# Patient Record
Sex: Male | Born: 1945 | Race: White | Hispanic: No | Marital: Married | Smoking: Former smoker
Health system: Southern US, Community
[De-identification: ages and names within clinical notes are randomized; demographics above are authoritative.]

## PROBLEM LIST (undated history)

## (undated) DIAGNOSIS — T7840XA Allergy, unspecified, initial encounter: Secondary | ICD-10-CM

## (undated) DIAGNOSIS — K219 Gastro-esophageal reflux disease without esophagitis: Secondary | ICD-10-CM

## (undated) DIAGNOSIS — K649 Unspecified hemorrhoids: Secondary | ICD-10-CM

## (undated) DIAGNOSIS — E785 Hyperlipidemia, unspecified: Secondary | ICD-10-CM

## (undated) DIAGNOSIS — H269 Unspecified cataract: Secondary | ICD-10-CM

## (undated) DIAGNOSIS — N529 Male erectile dysfunction, unspecified: Secondary | ICD-10-CM

## (undated) DIAGNOSIS — E538 Deficiency of other specified B group vitamins: Secondary | ICD-10-CM

## (undated) DIAGNOSIS — Z860100 Personal history of colon polyps, unspecified: Secondary | ICD-10-CM

## (undated) DIAGNOSIS — F419 Anxiety disorder, unspecified: Secondary | ICD-10-CM

## (undated) DIAGNOSIS — J439 Emphysema, unspecified: Secondary | ICD-10-CM

## (undated) HISTORY — DX: Anxiety disorder, unspecified: F41.9

## (undated) HISTORY — PX: APPENDECTOMY: SHX54

## (undated) HISTORY — DX: Hyperlipidemia, unspecified: E78.5

## (undated) HISTORY — DX: Male erectile dysfunction, unspecified: N52.9

## (undated) HISTORY — PX: HERNIA REPAIR: SHX51

## (undated) HISTORY — DX: Personal history of colon polyps, unspecified: Z86.0100

## (undated) HISTORY — PX: FOOT SURGERY: SHX648

## (undated) HISTORY — DX: Gastro-esophageal reflux disease without esophagitis: K21.9

## (undated) HISTORY — PX: KNEE SURGERY: SHX244

## (undated) HISTORY — DX: Allergy, unspecified, initial encounter: T78.40XA

## (undated) HISTORY — DX: Deficiency of other specified B group vitamins: E53.8

## (undated) HISTORY — DX: Unspecified hemorrhoids: K64.9

## (undated) HISTORY — DX: Emphysema, unspecified: J43.9

## (undated) HISTORY — DX: Unspecified cataract: H26.9

## (undated) HISTORY — DX: Personal history of colonic polyps: Z86.010

## (undated) HISTORY — PX: NASAL SINUS SURGERY: SHX719

## (undated) HISTORY — PX: CATARACT EXTRACTION, BILATERAL: SHX1313

## (undated) HISTORY — PX: VASECTOMY: SHX75

---

## 2013-05-17 ENCOUNTER — Encounter: Payer: Self-pay | Admitting: Internal Medicine

## 2013-06-09 ENCOUNTER — Ambulatory Visit (INDEPENDENT_AMBULATORY_CARE_PROVIDER_SITE_OTHER): Payer: Managed Care, Other (non HMO) | Admitting: Internal Medicine

## 2013-06-09 ENCOUNTER — Encounter: Payer: Self-pay | Admitting: Internal Medicine

## 2013-06-09 ENCOUNTER — Ambulatory Visit: Payer: Self-pay | Admitting: Internal Medicine

## 2013-06-09 VITALS — BP 140/86 | HR 64 | Ht 70.0 in | Wt 184.0 lb

## 2013-06-09 DIAGNOSIS — K602 Anal fissure, unspecified: Secondary | ICD-10-CM

## 2013-06-09 DIAGNOSIS — Z86010 Personal history of colonic polyps: Secondary | ICD-10-CM

## 2013-06-09 DIAGNOSIS — K625 Hemorrhage of anus and rectum: Secondary | ICD-10-CM

## 2013-06-09 DIAGNOSIS — K219 Gastro-esophageal reflux disease without esophagitis: Secondary | ICD-10-CM

## 2013-06-09 MED ORDER — ESOMEPRAZOLE MAGNESIUM 20 MG PO CPDR: 20.0000 mg | DELAYED_RELEASE_CAPSULE | ORAL | Status: DC

## 2013-06-09 NOTE — Progress Notes (Signed)
HISTORY OF PRESENT ILLNESS:  Steven Mcknight is a 68 y.o. male presents to establish GI care. Patient has a history of chronic GERD as well as colon polyps for which she has been cared for in [place]. He presents today regarding these issues as well as recent problems with rectal bleeding. The patient reports long-standing problems with GERD. Currently takes Nexium 20 mg every other day. On this regimen some breakthrough reflux symptoms for which he takes antacids and H2 receptor antagonists on demand. Some mild chronic unchanged intermittent dysphagia. Reports unremarkable EGD about 10 years ago. Terms of rectal bleeding, few weeks ago passed a hard bowel movement with painful burning/tearing sensation. Thereafter, red blood on the tissue. Several times thereafter similar discomfort and minor bleeding. Self medicated with Analpram. The problem has resolved. His last colonoscopy was 06/10/2011. No polyps. Outside records requested. Has had multiple colonoscopies since age 32. About once every 5 years for history of polyps. GI review of systems is otherwise negative  REVIEW OF SYSTEMS:  All non-GI ROS negative upon comprehensive review  Past Medical History  Diagnosis Date  . Hx of colonic polyps     Colonoscopy's in [place]   . GERD (gastroesophageal reflux disease)   . Hyperlipemia     Past Surgical History  Procedure Laterality Date  . Appendectomy    . Hernia repair      Social History Steven Mcknight  reports that he has quit smoking. He has never used smokeless tobacco. He reports that he drinks alcohol. He reports that he does not use illicit drugs.  family history is negative for Colon cancer.  No Known Allergies     PHYSICAL EXAMINATION:  Vital signs: BP 140/86  Pulse 64  Ht 5\' 10"  (1.778 m)  Wt 184 lb (83.462 kg)  BMI 26.40 kg/m2 General: Well-developed, well-nourished, no acute distress HEENT: Sclerae are anicteric, conjunctiva pink. Oral mucosa intact Lungs: Clear Heart:  Regular Abdomen: soft, nontender, nondistended, no obvious ascites, no peritoneal signs, normal bowel sounds. No organomegaly. Extremities: No edema Psychiatric: alert and oriented x3. Cooperative    ASSESSMENT:  #1. GERD. Breakthrough symptoms on every other day low-dose PPI #2. History of colon polyps. Last colonoscopy February 2013 #3. Recent problems with rectal discomfort and bleeding almost certainly secondary to fissure. Resolved.   PLAN:  #1. Reflux precautions #2. Recommend taking Nexium 20 mg daily, as opposed every other day, given breakthrough #3. Daily fiber supplementation with Metamucil to avoid hard stool recurrent fissure #4. Surveillance colonoscopy around February 2018. Recall placed in computer. Interval followup as needed

## 2013-06-09 NOTE — Patient Instructions (Signed)
We have sent the following medications to your pharmacy for you to pick up at your convenience: Nexium 20 mg-Take 1 capsule daily  You will be due for a recall colonoscopy in 05/2016. We will send you a reminder in the mail when it gets closer to that time.

## 2014-05-02 DIAGNOSIS — R931 Abnormal findings on diagnostic imaging of heart and coronary circulation: Secondary | ICD-10-CM | POA: Insufficient documentation

## 2014-05-02 HISTORY — DX: Abnormal findings on diagnostic imaging of heart and coronary circulation: R93.1

## 2014-05-09 HISTORY — PX: NM MYOVIEW LTD: HXRAD82

## 2015-04-20 ENCOUNTER — Other Ambulatory Visit: Payer: Self-pay | Admitting: Acute Care

## 2015-04-20 DIAGNOSIS — Z87891 Personal history of nicotine dependence: Secondary | ICD-10-CM

## 2015-05-10 ENCOUNTER — Ambulatory Visit
Admission: RE | Admit: 2015-05-10 | Discharge: 2015-05-10 | Disposition: A | Discharge status: Home / Self Care | Payer: Managed Care, Other (non HMO) | Source: Ambulatory Visit | Attending: Acute Care | Admitting: Acute Care

## 2015-05-10 DIAGNOSIS — Z87891 Personal history of nicotine dependence: Secondary | ICD-10-CM

## 2015-06-12 ENCOUNTER — Ambulatory Visit (INDEPENDENT_AMBULATORY_CARE_PROVIDER_SITE_OTHER): Payer: Managed Care, Other (non HMO)

## 2015-06-12 ENCOUNTER — Ambulatory Visit: Payer: Self-pay

## 2015-06-12 ENCOUNTER — Ambulatory Visit (INDEPENDENT_AMBULATORY_CARE_PROVIDER_SITE_OTHER): Payer: Managed Care, Other (non HMO) | Admitting: Podiatry

## 2015-06-12 ENCOUNTER — Encounter: Payer: Self-pay | Admitting: Podiatry

## 2015-06-12 VITALS — BP 141/84 | HR 57 | Resp 16 | Ht 70.0 in | Wt 175.0 lb

## 2015-06-12 DIAGNOSIS — M2041 Other hammer toe(s) (acquired), right foot: Secondary | ICD-10-CM

## 2015-06-12 DIAGNOSIS — M2042 Other hammer toe(s) (acquired), left foot: Secondary | ICD-10-CM

## 2015-06-12 DIAGNOSIS — D3610 Benign neoplasm of peripheral nerves and autonomic nervous system, unspecified: Secondary | ICD-10-CM | POA: Diagnosis not present

## 2015-06-12 NOTE — Progress Notes (Signed)
   Subjective:    Patient ID: Steven Mcknight, male    DOB: July 29, 1945, 70 y.o.   MRN: [id number]  HPI Patient presents with bilateral toe pain; 4th toes going underneath 3rd toes. Pt stated, "Right foot is worse"; x1 yr.   Review of Systems  All other systems reviewed and are negative.      Objective:   Physical Exam        Assessment & Plan:

## 2015-06-13 NOTE — Progress Notes (Signed)
Subjective:     Patient ID: Steven Mcknight, male   DOB: Sep 06, 1945, 70 y.o.   MRN: [id number]  HPI patient states he has a lot of pain in his right fourth toe which especially bothers him with golf. He feels like the toes curled and it causes problems and states the left one also does this but not to the same degree   Review of Systems  All other systems reviewed and are negative.      Objective:   Physical Exam  Constitutional: He is oriented to person, place, and time.  Cardiovascular: Intact distal pulses.   Musculoskeletal: Normal range of motion.  Neurological: He is oriented to person, place, and time.  Skin: Skin is warm.  Nursing note and vitals reviewed.  neurovascular status intact muscle strength adequate range of motion within normal limits with patient found to have significant structural malalignment of the fourth digit right over left with an abutment of the fourth against the third toe but no keratotic lesion formation. 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 We are going to try different padding systems and I discussed at one point distal arthroplasty will probably be necessary but he still hold off until after golf season. He will try the pads wider shoes and be seen back as needed  X-ray report indicated that there is good alignment except for significant distal rotation fourth toe right over left

## 2015-07-17 DIAGNOSIS — M722 Plantar fascial fibromatosis: Secondary | ICD-10-CM

## 2015-11-23 ENCOUNTER — Emergency Department (HOSPITAL_COMMUNITY)
Admission: EM | Admit: 2015-11-23 | Discharge: 2015-11-23 | Disposition: A | Discharge status: Home / Self Care | Payer: Medicare Other | Attending: Emergency Medicine | Admitting: Emergency Medicine

## 2015-11-23 ENCOUNTER — Emergency Department (HOSPITAL_COMMUNITY): Payer: Medicare Other

## 2015-11-23 ENCOUNTER — Encounter (HOSPITAL_COMMUNITY): Payer: Self-pay | Admitting: Family Medicine

## 2015-11-23 DIAGNOSIS — J0100 Acute maxillary sinusitis, unspecified: Secondary | ICD-10-CM | POA: Diagnosis not present

## 2015-11-23 DIAGNOSIS — R42 Dizziness and giddiness: Secondary | ICD-10-CM | POA: Diagnosis present

## 2015-11-23 DIAGNOSIS — Z87891 Personal history of nicotine dependence: Secondary | ICD-10-CM | POA: Diagnosis not present

## 2015-11-23 DIAGNOSIS — Z7982 Long term (current) use of aspirin: Secondary | ICD-10-CM | POA: Insufficient documentation

## 2015-11-23 DIAGNOSIS — H8110 Benign paroxysmal vertigo, unspecified ear: Secondary | ICD-10-CM | POA: Diagnosis not present

## 2015-11-23 LAB — COMPREHENSIVE METABOLIC PANEL
ALT: 23 U/L (ref 17–63)
AST: 27 U/L (ref 15–41)
Albumin: 3.9 g/dL (ref 3.5–5.0)
Alkaline Phosphatase: 74 U/L (ref 38–126)
Anion gap: 6 (ref 5–15)
BUN: 13 mg/dL (ref 6–20)
CHLORIDE: 105 mmol/L (ref 101–111)
CO2: 25 mmol/L (ref 22–32)
CREATININE: 0.92 mg/dL (ref 0.61–1.24)
Calcium: 9.5 mg/dL (ref 8.9–10.3)
Glucose, Bld: 127 mg/dL — ABNORMAL HIGH (ref 65–99)
POTASSIUM: 4.9 mmol/L (ref 3.5–5.1)
SODIUM: 136 mmol/L (ref 135–145)
Total Bilirubin: 1.8 mg/dL — ABNORMAL HIGH (ref 0.3–1.2)
Total Protein: 6.5 g/dL (ref 6.5–8.1)

## 2015-11-23 LAB — I-STAT TROPONIN, ED: TROPONIN I, POC: 0 ng/mL (ref 0.00–0.08)

## 2015-11-23 LAB — CBC WITH DIFFERENTIAL/PLATELET
BASOS ABS: 0.1 10*3/uL (ref 0.0–0.1)
Basophils Relative: 1 %
EOS ABS: 0.4 10*3/uL (ref 0.0–0.7)
EOS PCT: 9 %
HCT: 46.3 % (ref 39.0–52.0)
Hemoglobin: 15.4 g/dL (ref 13.0–17.0)
Lymphocytes Relative: 26 %
Lymphs Abs: 1.1 10*3/uL (ref 0.7–4.0)
MCH: 31.4 pg (ref 26.0–34.0)
MCHC: 33.3 g/dL (ref 30.0–36.0)
MCV: 94.5 fL (ref 78.0–100.0)
MONO ABS: 0.4 10*3/uL (ref 0.1–1.0)
Monocytes Relative: 11 %
Neutro Abs: 2.1 10*3/uL (ref 1.7–7.7)
Neutrophils Relative %: 53 %
PLATELETS: 193 10*3/uL (ref 150–400)
RBC: 4.9 MIL/uL (ref 4.22–5.81)
RDW: 12.9 % (ref 11.5–15.5)
WBC: 4 10*3/uL (ref 4.0–10.5)

## 2015-11-23 LAB — MAGNESIUM: MAGNESIUM: 1.9 mg/dL (ref 1.7–2.4)

## 2015-11-23 MED ORDER — DIAZEPAM 5 MG PO TABS: 5.0000 mg | ORAL_TABLET | Freq: Once | ORAL | Status: DC

## 2015-11-23 MED ORDER — SODIUM CHLORIDE 0.9 % IV BOLUS (SEPSIS)
1000.0000 mL | Freq: Once | INTRAVENOUS | Status: AC
  Administered 2015-11-23: 1000 mL via INTRAVENOUS

## 2015-11-23 MED ORDER — AMOXICILLIN-POT CLAVULANATE 875-125 MG PO TABS: 1.0000 | ORAL_TABLET | Freq: Two times a day (BID) | ORAL | 0 refills | Status: AC

## 2015-11-23 MED ORDER — AMOXICILLIN-POT CLAVULANATE 875-125 MG PO TABS: 1.0000 | ORAL_TABLET | Freq: Two times a day (BID) | ORAL | 0 refills | Status: DC

## 2015-11-23 MED ORDER — MECLIZINE HCL 25 MG PO TABS: 25.0000 mg | ORAL_TABLET | Freq: Three times a day (TID) | ORAL | 0 refills | Status: DC | PRN

## 2015-11-23 MED ORDER — MECLIZINE HCL 12.5 MG PO TABS: 12.5000 mg | ORAL_TABLET | Freq: Three times a day (TID) | ORAL | 0 refills | Status: DC | PRN

## 2015-11-23 MED ORDER — MECLIZINE HCL 25 MG PO TABS
25.0000 mg | ORAL_TABLET | Freq: Once | ORAL | Status: AC
  Administered 2015-11-23: 25 mg via ORAL
  Filled 2015-11-23: qty 1

## 2015-11-23 NOTE — ED Notes (Signed)
Transported to MRI

## 2015-11-23 NOTE — ED Notes (Signed)
Patient c/o dizziness onset yest am. States he isn't dizzy as long as he is sitting down.

## 2015-11-23 NOTE — ED Notes (Signed)
Patient remains in MRI 

## 2015-11-23 NOTE — ED Triage Notes (Signed)
Pt here for dizziness since yesterday morning. sts that he woke up this way. sts slight HA. Denies any numbness, weakness. No slurred speech. No facial droop. sts when he stands up the room spins. sts whe he lays still he isnt dizzy.

## 2015-11-23 NOTE — ED Provider Notes (Signed)
[facility] DEPT Provider Note   CSN: [id number] Arrival date & time: 11/23/15  [id number]  First Provider Contact:  First MD Initiated Contact with Patient 11/23/15 [phone]     History   Chief Complaint Chief Complaint  Patient presents with  . Dizziness    HPI Steven Mcknight is a 70 y.o. male.  HPI 70 year old male with past medical history of hypertension who presents with a 2 day history of intermittent dizziness. The patient states he was in his usual state of health 2 days ago. He golfed18 holes and had no difficulty. However, upon awakening in the morning yesterday he noticed that he had a sensation of dizziness. He describes the sensation as feeling as though the room was spinning with inability to walk due to feeling "drunk." Denies any alcohol use. His symptoms have persisted intermittently throughout the last 2 days. However, he has been able to drive and otherwise walk without difficulty. Denies any associated dysarthria, numbness, weakness, vision changes, or other neurological deficits. He states that his symptoms do worsen with position changes, as well as movement of his head. Denies any fevers or chills. Denies any recent head trauma.  Past Medical History:  Diagnosis Date  . GERD (gastroesophageal reflux disease)   . Hx of colonic polyps    Colonoscopy's in [place]   . Hyperlipemia     There are no active problems to display for this patient.   Past Surgical History:  Procedure Laterality Date  . APPENDECTOMY    . HERNIA REPAIR         Home Medications    Prior to Admission medications   Medication Sig Start Date End Date Taking? Authorizing Provider  aspirin 81 MG tablet Take 81 mg by mouth daily.   Yes Historical Provider, MD  atorvastatin (LIPITOR) 40 MG tablet Take 40 mg by mouth daily.   Yes Historical Provider, MD  esomeprazole (NEXIUM) 20 MG capsule Take 1 capsule (20 mg total) by mouth every other day. Patient taking differently: Take 20 mg by mouth daily.   06/09/13  Yes Irene Shipper, MD  OVER THE COUNTER MEDICATION Place 2 sprays into both nostrils as needed (for congestion). OTC [employer] Nasal Spray   Yes Historical Provider, MD  amoxicillin-clavulanate (AUGMENTIN) 875-125 MG tablet Take 1 tablet by mouth every 12 (twelve) hours. 11/23/15 12/03/15  Duffy Bruce, MD  meclizine (ANTIVERT) 12.5 MG tablet Take 1-2 tablets (12.5-25 mg total) by mouth 3 (three) times daily as needed for dizziness (Take 1 tablet for mild dizziness, take 2 tablets for severe dizziness or nausea). 11/23/15   Duffy Bruce, MD    Family History Family History  Problem Relation Age of Onset  . Colon cancer Neg Hx     Social History Social History  Substance Use Topics  . Smoking status: Former Research scientist (life sciences)  . Smokeless tobacco: Never Used  . Alcohol use Yes     Comment: one daily      Allergies   Review of patient's allergies indicates no known allergies.   Review of Systems Review of Systems  Constitutional: Positive for fatigue. Negative for activity change and fever.  HENT: Negative for congestion and rhinorrhea.   Eyes: Negative for visual disturbance.  Respiratory: Negative for cough and shortness of breath.   Cardiovascular: Negative for chest pain and leg swelling.  Gastrointestinal: Negative for abdominal pain, diarrhea and nausea.  Genitourinary: Negative for dysuria and flank pain.  Musculoskeletal: Negative for neck pain and neck stiffness.  Neurological: Positive for  dizziness, light-headedness and headaches. Negative for speech difficulty.     Physical Exam Updated Vital Signs BP 153/89 (BP Location: Right Arm)   Pulse (!) 51   Temp 98.3 F (36.8 C) (Oral)   Resp 18   SpO2 99%   Physical Exam  Constitutional: He appears well-developed and well-nourished. No distress.  HENT:  Head: Normocephalic.  Mouth/Throat: Oropharynx is clear and moist. No oropharyngeal exudate.  Eyes: Conjunctivae are normal. Pupils are equal, round, and reactive to  light.  Neck: Normal range of motion. Neck supple.  Cardiovascular: Normal rate, regular rhythm and normal heart sounds.  Exam reveals no friction rub.   No murmur heard. Pulmonary/Chest: Effort normal and breath sounds normal. No respiratory distress. He has no wheezes. He has no rales.  Abdominal: Soft. He exhibits no distension. There is no tenderness. There is no guarding.  Musculoskeletal: He exhibits no edema.  Neurological: He is alert. He exhibits normal muscle tone.  Skin: Skin is warm.  Nursing note and vitals reviewed.   Neurological Exam:  Mental Status: Alert and oriented to person, place, and time. Attention and concentration normal. Speech clear. Recent memory is intact. Cranial Nerves: Visual fields intact to confrontation in all quadrants bilaterally. EOMI and PERRLA. No nystagmus noted. Facial sensation intact at forehead, maxillary cheek, and chin/mandible bilaterally. No weakness of masticatory muscles. No facial asymmetry or weakness. Hearing grossly normal to finer rub. Uvula is midline, and palate elevates symmetrically. Normal SCM and trapezius strength. Tongue midline without fasciculations. No nystagmus. Motor: Muscle strength 5/5 in proximal and distal UE and LE bilaterally. No pronator drift. Muscle tone normal. Reflexes: 2+ and symmetrical in all four extremities.   Sensation: Intact to light touch in upper and lower extremities distally bilaterally.  Gait: Normal without ataxia.  Coordination: Normal FTN bilaterally. Mild, low frequency central tremor.    ED Treatments / Results  Labs (all labs ordered are listed, but only abnormal results are displayed) Labs Reviewed  COMPREHENSIVE METABOLIC PANEL - Abnormal; Notable for the following:       Result Value   Glucose, Bld 127 (*)    Total Bilirubin 1.8 (*)    All other components within normal limits  CBC WITH DIFFERENTIAL/PLATELET  MAGNESIUM  I-STAT TROPOININ, ED    EKG  EKG  Interpretation  Date/Time:  Thursday November 23 2015 07:18:36 EDT Ventricular Rate:  84 PR Interval:    QRS Duration: 97 QT Interval:  386 QTC Calculation: 457 R Axis:   -71 Text Interpretation:  Sinus rhythm Multiple ventricular premature complexes Probable left atrial enlargement Left anterior fascicular block Consider anterior infarct Baseline wander in lead(s) V3 No old tracing to compare Non-specific ST-t changes Confirmed by Ellender Hose MD, Lysbeth Galas [phone]) on 11/23/2015 7:51:06 AM       Radiology Mr Brain Wo Contrast  Result Date: 11/23/2015 CLINICAL DATA:  Dizziness beginning yesterday. EXAM: MRI HEAD WITHOUT CONTRAST TECHNIQUE: Multiplanar, multiecho pulse sequences of the brain and surrounding structures were obtained without intravenous contrast. COMPARISON:  None. FINDINGS: Diffusion imaging does not show any acute or subacute infarction. The brain appears normal without evidence of any old small or large vessel insult. No mass lesion, hemorrhage, hydrocephalus or extra-axial collection. Major vessels at the base of the brain show flow. No fluid in the middle ears or mastoids. There is mucosal inflammatory disease of the paranasal sinuses with mucosal thickening and retention cysts. IMPRESSION: Normal appearance of the brain itself. Mucosal inflammatory disease of the paranasal sinuses. Electronically Signed  By: Nelson Chimes M.D.   On: 11/23/2015 09:59    Procedures Procedures (including critical care time)  Medications Ordered in ED Medications  sodium chloride 0.9 % bolus 1,000 mL (0 mLs Intravenous Stopped 11/23/15 1050)  meclizine (ANTIVERT) tablet 25 mg (25 mg Oral Given 11/23/15 0800)     Initial Impression / Assessment and Plan / ED Course  I have reviewed the triage vital signs and the nursing notes.  Pertinent labs & imaging results that were available during my care of the patient were reviewed by me and considered in my medical decision making (see chart for  details).  Clinical Course   70 yo M with PMHx as above who p/w mild headache and new onset, positional vertigo. VSS and WNL. Exam is as above. Findings are most c/w BPPV or other peripheral vertigo, though nystagmus not impressive on exam. Moreover, pt noted to have mild, central tremor and he is unsure if this is new. While it is possible this is a chronic issue, given his c/o vertigo with ? New tremor will obtain MR Brain to eval for posterior CVA. Otherwise, screening lab work is unremarkable. EKG is non-ischemic and trop negative despite sx x 2 days - doubt ACS or arrhythmia. Pt in agreement. No improvement with attempted Dix-Hallpike.  MRI negative. Symptoms improved with meclizine. Will d/c with meclizine, outpatient ENT follow-up, and return precautions.  Final Clinical Impressions(s) / ED Diagnoses   Final diagnoses:  Acute maxillary sinusitis, recurrence not specified  BPPV (benign paroxysmal positional vertigo), unspecified laterality    New Prescriptions Discharge Medication List as of 11/23/2015 10:17 AM       Duffy Bruce, MD 11/23/15 2020

## 2015-12-14 ENCOUNTER — Other Ambulatory Visit: Payer: Self-pay | Admitting: Acute Care

## 2015-12-14 DIAGNOSIS — Z87891 Personal history of nicotine dependence: Secondary | ICD-10-CM

## 2015-12-18 DIAGNOSIS — H8120 Vestibular neuronitis, unspecified ear: Secondary | ICD-10-CM | POA: Insufficient documentation

## 2015-12-18 DIAGNOSIS — J342 Deviated nasal septum: Secondary | ICD-10-CM | POA: Insufficient documentation

## 2015-12-18 DIAGNOSIS — R0683 Snoring: Secondary | ICD-10-CM | POA: Insufficient documentation

## 2015-12-18 DIAGNOSIS — R51 Headache: Secondary | ICD-10-CM

## 2015-12-18 DIAGNOSIS — R519 Headache, unspecified: Secondary | ICD-10-CM | POA: Insufficient documentation

## 2015-12-18 DIAGNOSIS — K219 Gastro-esophageal reflux disease without esophagitis: Secondary | ICD-10-CM | POA: Insufficient documentation

## 2016-02-27 ENCOUNTER — Telehealth: Payer: Self-pay | Admitting: *Deleted

## 2016-02-27 NOTE — Telephone Encounter (Signed)
"  I am calling to schedule my surgery with Dr. Paulla Dolly.  He told me to call when I was ready to have it done."  Have you signed consent forms?  "I Buster't think I have."  You are going to need to see Dr. Paulla Dolly for a consultation.  "I already saw him."  You saw him for a regular visit.  You will need to see him for a consultation appointment for surgery.  You will receive a 3 page consent form to sign and a surgical kit.  I can put you down tentatively for surgery.  When would you like to schedule?  "I would like to do it January 16."  I will put you down tentatively for that date.  I will transfer you to a scheduler so you can make a consult appointment with Dr. Paulla Dolly.

## 2016-03-25 ENCOUNTER — Ambulatory Visit (INDEPENDENT_AMBULATORY_CARE_PROVIDER_SITE_OTHER): Payer: Medicare Other | Admitting: Podiatry

## 2016-03-25 ENCOUNTER — Encounter: Payer: Self-pay | Admitting: Podiatry

## 2016-03-25 VITALS — BP 153/95 | HR 75

## 2016-03-25 DIAGNOSIS — D3610 Benign neoplasm of peripheral nerves and autonomic nervous system, unspecified: Secondary | ICD-10-CM

## 2016-03-25 DIAGNOSIS — M2041 Other hammer toe(s) (acquired), right foot: Secondary | ICD-10-CM

## 2016-03-25 NOTE — Patient Instructions (Signed)

## 2016-03-27 ENCOUNTER — Telehealth: Payer: Self-pay | Admitting: *Deleted

## 2016-03-27 NOTE — Progress Notes (Signed)
Subjective:     Patient ID: Steven Mcknight, male   DOB: [birth year], 70 y.o.   MRN: [id number]  HPI patient states that the fourth toe on the right foot has been very sore at the distal portion and making it hard for the patient to wear shoe gear comfortably. States that he's tried modalities and wants to have the toe straightened   Review of Systems     Objective:   Physical Exam Neurovascular status intact muscle strength adequate with patient found have distal rotation fourth digit right with keratotic lesion formation on the lateral portion that is sore when palpated with no current neuroma symptomatology    Assessment:     What appears to be distal rotation fourth digit right that's causing symptoms    Plan:     Spent a great of time going over with this patient the etiology of this and discussing the causes of a rotated toe. Patient wants to have surgery and at this point I did allow patient to read consent form going over alternative treatments complications for derotational arthroplasty. There is no guarantees this will solve all symptoms and patient understands this and signs consent form and is scheduled for outpatient surgery [facility]

## 2016-03-27 NOTE — Telephone Encounter (Signed)
"  I'm scheduled for surgery on January 16.  I want to move it to January 9."  Dr. Paulla Dolly does not have anything available on January 9.  "Okay, leave it where it is."

## 2016-05-07 ENCOUNTER — Encounter: Payer: Self-pay | Admitting: Podiatry

## 2016-05-07 DIAGNOSIS — M2041 Other hammer toe(s) (acquired), right foot: Secondary | ICD-10-CM | POA: Diagnosis not present

## 2016-05-10 ENCOUNTER — Telehealth: Payer: Self-pay

## 2016-05-10 NOTE — Telephone Encounter (Signed)
Spoke with pt regarding post operative status. He asked abouit shoe usage. Advised him to wear surgical shoe or boot at all times. He states he is managing pain effectively. Denied s/s of infection. He is to call with any acute symptom changes

## 2016-05-15 ENCOUNTER — Ambulatory Visit (INDEPENDENT_AMBULATORY_CARE_PROVIDER_SITE_OTHER): Payer: Self-pay | Admitting: Podiatry

## 2016-05-15 ENCOUNTER — Encounter: Payer: Self-pay | Admitting: Podiatry

## 2016-05-15 ENCOUNTER — Ambulatory Visit (INDEPENDENT_AMBULATORY_CARE_PROVIDER_SITE_OTHER): Payer: Medicare Other

## 2016-05-15 VITALS — Temp 98.7°F

## 2016-05-15 DIAGNOSIS — M2041 Other hammer toe(s) (acquired), right foot: Secondary | ICD-10-CM

## 2016-05-15 NOTE — Progress Notes (Signed)
Subjective:     Patient ID: Steven Mcknight, male   DOB: [birth year], 71 y.o.   MRN: [id number]  HPI patient states she's doing fine with no pain   Review of Systems     Objective:   Physical Exam Neurovascular status intact with patient's fourth digit right doing well wound edges well coapted with good alignment of the toe    Assessment:     Doing well post arthroplasty fourth digit right    Plan:     X-ray reviewed sterile dressing applied and reappoint 2 weeks for stitch removal or earlier if needed  X-ray report indicated digits and good alignment with no indications of pathology

## 2016-05-20 NOTE — Progress Notes (Signed)
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

## 2016-05-22 ENCOUNTER — Ambulatory Visit (INDEPENDENT_AMBULATORY_CARE_PROVIDER_SITE_OTHER): Payer: Medicare Other | Admitting: Neurology

## 2016-05-22 ENCOUNTER — Encounter: Payer: Self-pay | Admitting: Neurology

## 2016-05-22 VITALS — BP 136/70 | HR 78 | Resp 16 | Ht 70.0 in | Wt 177.0 lb

## 2016-05-22 DIAGNOSIS — K219 Gastro-esophageal reflux disease without esophagitis: Secondary | ICD-10-CM

## 2016-05-22 DIAGNOSIS — R519 Headache, unspecified: Secondary | ICD-10-CM

## 2016-05-22 DIAGNOSIS — R51 Headache: Secondary | ICD-10-CM

## 2016-05-22 DIAGNOSIS — G2581 Restless legs syndrome: Secondary | ICD-10-CM | POA: Diagnosis not present

## 2016-05-22 DIAGNOSIS — R400 Somnolence: Secondary | ICD-10-CM

## 2016-05-22 DIAGNOSIS — G253 Myoclonus: Secondary | ICD-10-CM

## 2016-05-22 DIAGNOSIS — R0683 Snoring: Secondary | ICD-10-CM

## 2016-05-22 DIAGNOSIS — G478 Other sleep disorders: Secondary | ICD-10-CM

## 2016-05-22 NOTE — Patient Instructions (Addendum)
Based on your symptoms and your exam I believe you are at risk for obstructive sleep apnea or OSA, and I think we should proceed with a sleep study to determine whether you do or do not have OSA and how severe it is. If you have more than mild OSA, I want you to consider treatment with CPAP. Please remember, the risks and ramifications of moderate to severe obstructive sleep apnea or OSA are: Cardiovascular disease, including congestive heart failure, stroke, difficult to control hypertension, arrhythmias, and even type 2 diabetes has been linked to untreated OSA. Sleep apnea causes disruption of sleep and sleep deprivation in most cases, which, in turn, can cause recurrent headaches, problems with memory, mood, concentration, focus, and vigilance. Most people with untreated sleep apnea report excessive daytime sleepiness, which can affect their ability to drive. Please do not drive if you feel sleepy.   You may have mild restless legs symptoms, we will look I will likely see you back after your sleep study to go over the test results and where to go from there. We will call you after your sleep study to advise about the results (most likely, you will hear from Beverlee Nims, my nurse) and to set up an appointment at the time, as necessary.    Our sleep lab administrative assistant, Arrie Aran will meet with you or call you to schedule your sleep study. If you Amedee't hear back from her by next week please feel free to call her at [phone]. This is her direct line and please leave a message with your phone number to call back if you get the voicemail box. She will call back as soon as possible.

## 2016-05-22 NOTE — Progress Notes (Addendum)
Subjective:    Patient ID: Steven Mcknight is a 71 y.o. male.  HPI     Star Age, MD, PhD [facility] [address] [place], [place] [address]  Dear Dr. Ardeth Perfect,   I saw your patient, Steven Mcknight, upon your kind request in my neurologic clinic today for initial consultation of his sleep disorder, in particular, concern for underlying obstructive sleep apnea. The patient is unaccompanied today. As you know, [name] is a 71 year old right-handed gentleman with an underlying medical history of hyperlipidemia, hyperbilirubinemia, anxiety, overweight state, reflux disease, and recent right fourth hammertoe repair on 05/07/2016, who reports snoring and excessive daytime somnolence, occasional morning headaches. I reviewed your office note from 04/29/2016. Of note, his Epworth sleepiness score is 11 out of 24 today, his fatigue score is 36 out of 63. He is retired, he lives with his wife, has 2 grown children in [place], stepchild in [place] as well. He quit smoking 10 years ago and drinks alcohol one drink/day, occasional cigar, usually no day-to-day caffeine, typically decaf coffee or half-and-half in the morning. He tries to get enough sleep, goes to bed between 9 PM and 11 PM typically, wakeup time between 7 and 7:30 AM but does not typically wake up rested, does not have night to night nocturia, has occasional jerking movements in his sleep, particularly with leg jerking, endorses occasional restless leg symptoms. He has occasional morning headaches, no personal history of migraine headaches. He does have nocturnal reflux symptoms and postnasal drip. He uses Xanax as needed, not daily, he is on a daily low-dose aspirin, atorvastatin and Nexium for his reflux.   He has a Hx of RLL lung nodules. Being monitored for this. I reviewed his CT chest report from January 2017, I understand he has to be scheduled for a repeat scan to monitor. His weight has remained fairly  stable. He exercises regularly. He is retired from [employer].   His Past Medical History Is Significant For: Past Medical History:  Diagnosis Date  . Anxiety   . GERD (gastroesophageal reflux disease)   . Hx of colonic polyps    Colonoscopy's in [place]   . Hyperlipemia     His Past Surgical History Is Significant For: Past Surgical History:  Procedure Laterality Date  . APPENDECTOMY    . HERNIA REPAIR    . KNEE SURGERY    . NASAL SINUS SURGERY      His Family History Is Significant For: Family History  Problem Relation Age of Onset  . Melanoma Father   . Colon cancer Neg Hx     His Social History Is Significant For: Social History   Social History  . Marital status: Married    Spouse name: N/A  . Number of children: 2  . Years of education: BS   Occupational History  . Retired    Social History Main Topics  . Smoking status: Former Research scientist (life sciences)  . Smokeless tobacco: Never Used  . Alcohol use Yes     Comment: one daily   . Drug use: No  . Sexual activity: Not Asked   Other Topics Concern  . None   Social History Narrative   Daily caffeine           His Allergies Are:  No Known Allergies:   His Current Medications Are:  Outpatient Encounter Prescriptions as of 05/22/2016  Medication Sig  . ALPRAZolam (XANAX) 0.25 MG tablet Take 0.25 mg by mouth as  needed for anxiety.  Marland Kitchen aspirin 81 MG tablet Take 81 mg by mouth daily.  Marland Kitchen atorvastatin (LIPITOR) 40 MG tablet Take 40 mg by mouth daily.  Marland Kitchen esomeprazole (NEXIUM) 20 MG capsule Take 1 capsule (20 mg total) by mouth every other day. (Patient taking differently: Take 20 mg by mouth daily. )  . esomeprazole (NEXIUM) 20 MG capsule Take by mouth.  Marland Kitchen OVER THE COUNTER MEDICATION Place 2 sprays into both nostrils as needed (for congestion). OTC [employer] Nasal Spray  . [DISCONTINUED] HYDROcodone-acetaminophen (NORCO/VICODIN) 5-325 MG tablet Take 1 tablet by mouth every 4 (four) hours as needed for moderate pain.  .  [DISCONTINUED] meclizine (ANTIVERT) 12.5 MG tablet Take 1-2 tablets (12.5-25 mg total) by mouth 3 (three) times daily as needed for dizziness (Take 1 tablet for mild dizziness, take 2 tablets for severe dizziness or nausea).   No facility-administered encounter medications on file as of 05/22/2016.   : Review of Systems:  Out of a complete 14 point review of systems, all are reviewed and negative with the exception of these symptoms as listed below: Review of Systems  Neurological:       No trouble falling and staying asleep, snores, wakes up feeling tired, daytime fatigue, occasional morning headache.   States that he has never had a sleep study.   Epworth Sleepiness Scale 0= would never doze 1= slight chance of dozing 2= moderate chance of dozing 3= high chance of dozing  Sitting and reading:1 Watching TV: 3 Sitting inactive in a public place (ex. Theater or meeting):1 As a passenger in a car for an hour without a break:1 Lying down to rest in the afternoon:3 Sitting and talking to someone:0 Sitting quietly after lunch (no alcohol):2 In a car, while stopped in traffic:0 Total:11   Objective:  Neurologic Exam  Physical Exam Physical Examination:   Vitals:   05/22/16 1059  BP: 136/70  Pulse: 78  Resp: 16    General Examination: The patient is a very pleasant 71 y.o. male in no acute distress. He appears well-developed and well-nourished and well groomed.   HEENT: Normocephalic, atraumatic, pupils are equal, round and reactive to light and accommodation. Funduscopic exam is normal with sharp disc margins noted. Status post bilateral cataract repairs. Extraocular tracking is good without limitation to gaze excursion or nystagmus noted. Normal smooth pursuit is noted. Hearing is grossly intact. Face is symmetric with normal facial animation and normal facial sensation. Speech is clear with no dysarthria noted. There is no hypophonia. There is no lip, neck/head, jaw or voice  tremor. Neck is supple with full range of passive and active motion. There are no carotid bruits on auscultation. Oropharynx exam reveals: mild mouth dryness, adequate dental hygiene and mild airway crowding, due to longer uvula, thicker tongue, tonsils in place, 1+. Mallampati is class II. Tongue protrudes centrally and palate elevates symmetrically. Neck size is 16.25 inches. He has no significant overbite, has a slightly shifted jaw alignment, does report TMJ issues on the left, nasal inspection shows mild deviated septum to the left, otherwise benign findings.   Chest: Clear to auscultation without wheezing, rhonchi or crackles noted with the exception of mild crackles noted at the right lung base.  Heart: S1+S2+0, regular and normal without murmurs, rubs or gallops noted.   Abdomen: Soft, non-tender and non-distended with normal bowel sounds appreciated on auscultation.  Extremities: There is no pitting edema in the distal lower extremities bilaterally. Pedal pulses are intact.  Skin: Warm and dry without  trophic changes noted.  Musculoskeletal: exam reveals no obvious joint deformities, tenderness or joint swelling or erythema.   Neurologically:  Mental status: The patient is awake, alert and oriented in all 4 spheres. His immediate and remote memory, attention, language skills and fund of knowledge are appropriate. There is no evidence of aphasia, agnosia, apraxia or anomia. Speech is clear with normal prosody and enunciation. Thought process is linear. Mood is normal and affect is normal.  Cranial nerves II - XII are as described above under HEENT exam. In addition: shoulder shrug is normal with equal shoulder height noted. Motor exam: Normal bulk, strength and tone is noted. There is no drift, tremor or rebound. Romberg is negative. Reflexes are 2+ throughout. Fine motor skills and coordination: intact with normal finger taps, normal hand movements, normal rapid alternating patting, normal  foot taps and normal foot agility.  Cerebellar testing: No dysmetria or intention tremor on finger to nose testing. Heel to shin is unremarkable bilaterally. There is no truncal or gait ataxia.  Sensory exam: intact to light touch, pinprick, vibration, temperature sense in the upper and lower extremities.  Gait, station and balance: He stands easily. No veering to one side is noted. No leaning to one side is noted. Posture is age-appropriate and stance is narrow based. Gait shows normal stride length and normal pace. No problems turning are noted. Tandem walk is unremarkable.  Assessment and plan:  In summary, Fionn Willitts is a very pleasant 71 y.o.-year old male with an underlying medical history of hyperlipidemia, hyperbilirubinemia, anxiety, overweight state, reflux disease, and recent right fourth hammertoe repair on 05/07/2016, who reports snoring and excessive daytime somnolence, occasional morning headaches, his history and physical exam are concerning for obstructive sleep apnea (OSA). In addition, he endorses RLS symptoms and leg jerking occasionally at night, also night time reflux Sx.  I had a long chat with the patient about my findings and the diagnosis of OSA, its prognosis and treatment options. We talked about medical treatments, surgical interventions and non-pharmacological approaches. I explained in particular the risks and ramifications of untreated moderate to severe OSA, especially with respect to developing cardiovascular disease down the Road, including congestive heart failure, difficult to treat hypertension, cardiac arrhythmias, or stroke. Even type 2 diabetes has, in part, been linked to untreated OSA. Symptoms of untreated OSA include daytime sleepiness, memory problems, mood irritability and mood disorder such as depression and anxiety, lack of energy, as well as recurrent headaches, especially morning headaches. We talked about trying to maintain a healthy lifestyle in general, as  well as the importance of weight control. I encouraged the patient to eat healthy, exercise daily and keep well hydrated, to keep a scheduled bedtime and wake time routine, to not skip any meals and eat healthy snacks in between meals. I advised the patient not to drive when feeling sleepy. I recommended the following at this time: sleep study with potential positive airway pressure titration. (We will score hypopneas at 4% and split the sleep study into diagnostic and treatment portion, if has has evidence of severe OSA).   I explained the sleep test procedure to the patient and also outlined possible surgical and non-surgical treatment options of OSA, including the use of a custom-made dental device (which would require a referral to a specialist dentist or oral surgeon), upper airway surgical options, such as pillar implants, radiofrequency surgery, tongue base surgery, and UPPP (which would involve a referral to an ENT surgeon). Rarely, jaw surgery such as mandibular  advancement may be considered.  I also explained the CPAP treatment option to the patient, who indicated that he would be willing to try CPAP if the need arises. I answered all his questions today and the patient was in agreement. I would like to see him back after the sleep study is completed and encouraged him to call with any interim questions, concerns, problems or updates.   Thank you very much for allowing me to participate in the care of this nice patient. If I can be of any further assistance to you please do not hesitate to call me at [phone].  Sincerely,   Star Age, MD, PhD

## 2016-05-28 ENCOUNTER — Other Ambulatory Visit: Payer: Self-pay | Admitting: Internal Medicine

## 2016-05-28 DIAGNOSIS — R911 Solitary pulmonary nodule: Secondary | ICD-10-CM

## 2016-05-29 ENCOUNTER — Inpatient Hospital Stay
Admission: RE | Admit: 2016-05-29 | Discharge: 2016-05-29 | Disposition: A | Discharge status: Home / Self Care | Payer: Medicare Other | Source: Ambulatory Visit | Attending: Internal Medicine | Admitting: Internal Medicine

## 2016-05-29 ENCOUNTER — Ambulatory Visit (INDEPENDENT_AMBULATORY_CARE_PROVIDER_SITE_OTHER): Payer: Medicare Other | Admitting: Podiatry

## 2016-05-29 DIAGNOSIS — D3610 Benign neoplasm of peripheral nerves and autonomic nervous system, unspecified: Secondary | ICD-10-CM

## 2016-05-29 DIAGNOSIS — M2041 Other hammer toe(s) (acquired), right foot: Secondary | ICD-10-CM

## 2016-05-31 ENCOUNTER — Ambulatory Visit
Admission: RE | Admit: 2016-05-31 | Discharge: 2016-05-31 | Disposition: A | Discharge status: Home / Self Care | Payer: Medicare Other | Source: Ambulatory Visit | Attending: Internal Medicine | Admitting: Internal Medicine

## 2016-05-31 DIAGNOSIS — R911 Solitary pulmonary nodule: Secondary | ICD-10-CM

## 2016-06-04 ENCOUNTER — Ambulatory Visit (INDEPENDENT_AMBULATORY_CARE_PROVIDER_SITE_OTHER): Payer: Medicare Other | Admitting: Neurology

## 2016-06-04 DIAGNOSIS — G4720 Circadian rhythm sleep disorder, unspecified type: Secondary | ICD-10-CM

## 2016-06-04 DIAGNOSIS — G4733 Obstructive sleep apnea (adult) (pediatric): Secondary | ICD-10-CM | POA: Diagnosis not present

## 2016-06-04 DIAGNOSIS — G4761 Periodic limb movement disorder: Secondary | ICD-10-CM

## 2016-06-04 DIAGNOSIS — R9431 Abnormal electrocardiogram [ECG] [EKG]: Secondary | ICD-10-CM

## 2016-06-07 NOTE — Progress Notes (Signed)
Patient referred by Dr. Ardeth Perfect, seen by me on 05/22/16, diagnostic PSG on 06/04/16.   Please call and notify the patient that the recent sleep study did not show any significant obstructive sleep apnea: an overall high normal AHI of 4.2/hour, mild obstructive sleep apnea during REM sleep with a  REM AHI of 9.4/hour, and moderate supine sleep apnea with a supine AHI of 24.4/hour, and O2 nadir of 90%. CPAP therapy is not warranted, but it may help to sleep off the back and lose some  weight. This may also help the mild to moderate snoring. He had moderate PLMs (periodic limb movements of sleep). Patient does endorse RLS symptoms. Please inform patient that I would like to go over the details of the study during a follow up appointment. Arrange a followup appointment. Also, route or fax report to PCP and referring MD, if other than PCP.  Once you have spoken to patient, you can close this encounter.   Thanks,  Star Age, MD, PhD [facility])

## 2016-06-07 NOTE — Procedures (Signed)
PATIENT'S NAME:  Steven Mcknight, Steven Mcknight DOB:      03-Nov-1945      MR#:    [id number]     DATE OF RECORDING: 06/04/2016 REFERRING M.D.:  Velna Hatchet, MD Study Performed:   Baseline Polysomnogram HISTORY: Mr. Dambra is a 71 year old man with a history of hyperlipidemia, hyperbilirubinemia, anxiety, overweight state, reflux disease, and recent right fourth hammertoe repair on 05/07/2016, who reports snoring and excessive daytime somnolence, occasional morning headaches.  The patient endorsed the Epworth Sleepiness Scale at 11/24 points. The patient's weight 177 pounds with a height of 70 (inches), resulting in a BMI of 25.2 kg/m2. The patient's neck circumference measured 16.2 inches.  CURRENT MEDICATIONS: Alprazolam, Aspirin, Atorvastatin, Esomeprazole   PROCEDURE:  This is a multichannel digital polysomnogram utilizing the Somnostar 11.2 system.  Electrodes and sensors were applied and monitored per AASM Specifications.   EEG, EOG, Chin and Limb EMG, were sampled at 200 Hz.  ECG, Snore and Nasal Pressure, Thermal Airflow, Respiratory Effort, CPAP Flow and Pressure, Oximetry was sampled at 50 Hz. Digital video and audio were recorded.      BASELINE STUDY  Lights Out was at 21:21 and Lights On at 05:01.  Total recording time (TRT) was 461 minutes, with a total sleep time (TST) of  286.5 minutes.   The patient's sleep latency was 78.5 minutes, which is prolonged.  REM latency was 91 minutes, which is normal.  The sleep efficiency was 62.1 %, which is reduced.     SLEEP ARCHITECTURE: WASO (Wake after sleep onset) was 113 minutes with several longer periods of wakefulness.  There were 17.5 minutes in Stage N1, 224.5 minutes Stage N2, 0 minutes Stage N3 and 44.5 minutes in Stage REM.  The percentage of Stage N1 was 6.1%, Stage N2 was 78.4%, which is increased, Stage N3 was absent, and Stage R (REM sleep) was 15.5%, which is mildly reduced.   The arousals were noted as: 24 were spontaneous, 19 were associated with  PLMs, 20 were associated with respiratory events.   Audio and video analysis did not show any abnormal or unusual movements, behaviors, phonations or vocalizations.   The patient took no bathroom breaks. Mild to moderate snoring was noted. The EKG showed rare PVCs.    RESPIRATORY ANALYSIS:  There were a total of 20 respiratory events:  4 obstructive apneas, 0 central apneas and 0 mixed apneas with a total of 4 apneas and an apnea index (AI) of .8 /hour. There were 16 hypopneas with a hypopnea index of 3.4 /hour. The patient also had 0 respiratory event related arousals (RERAs).      The total APNEA/HYPOPNEA INDEX (AHI) was 4.2/hour and the total RESPIRATORY DISTURBANCE INDEX was 4.2 /hour.  7 events occurred in REM sleep and 20 events in NREM. The REM AHI was 9.4 /hour, versus a non-REM AHI of 3.2. The patient spent 29.5 minutes of total sleep time in the supine position and 257 minutes in non-supine.. The supine AHI was 24.4 versus a non-supine AHI of 1.9.  OXYGEN SATURATION & C02:  The Wake baseline 02 saturation was 98%, with the lowest being 90%. Time spent below 89% saturation equaled 0 minutes.  PERIODIC LIMB MOVEMENTS:   The patient had a total of 187 Periodic Limb Movements.  The Periodic Limb Movement (PLM) index was 39.2 and the PLM Arousal index was 4./hour.    Post-study, the patient indicated that sleep was the same or worse than usual.   IMPRESSION:  1. Obstructive Sleep Apnea (  OSA), positional and stage related 2. Periodic Limb Movement Disorder (PLMD) 3. Nonspecific abnormal EKG 4. Dysfunctions associated with sleep stages or arousal from sleep  RECOMMENDATIONS:  1. This study demonstrates an overall high normal AHI of 4.2/hour, mild obstructive sleep apnea during REM sleep with a REM AHI of 9.4/hour, and moderate supine sleep apnea with a supine AHI of 24.4/hour, and O2 nadir of 90%. CPAP therapy is not warranted, but it may help to sleep off the back and lose some weight.  This may also help the mild to moderate snoring.     2. Moderate PLMs (periodic limb movements of sleep) were noted during the study without mild or minimal arousals; clinical correlation is recommended. Patient does endorse RLS symptoms.  3. The study showed rare PVCs on single lead EKG; clinical correlation is recommended and consultation with cardiology may be feasible.  4. The patient should be cautioned not to drive, work at heights, or operate dangerous or heavy equipment when tired or sleepy. Review and reiteration of good sleep hygiene measures should be pursued with any patient. 5. This study shows sleep fragmentation and abnormal sleep stage percentages; these are nonspecific findings and per se do not signify an intrinsic sleep disorder or a cause for the patient's sleep-related symptoms. Causes include (but are not limited to) the first night effect of the sleep study, circadian rhythm disturbances, medication effect or an underlying mood disorder or medical problem.  6. The patient will be seen in follow-up by Dr. Rexene Alberts at [facility] for discussion of the test results and further management strategies. The referring provider will be notified of the test results.  I certify that I have reviewed the entire raw data recording prior to the issuance of this report in accordance with the Standards of Accreditation of the American Academy of Sleep Medicine (AASM)   Star Age, MD, PhD Diplomat, American Board of Psychiatry and Neurology (Neurology and Sleep Medicine)

## 2016-06-10 ENCOUNTER — Telehealth: Payer: Self-pay

## 2016-06-10 NOTE — Telephone Encounter (Signed)
I called pt. I advised him of his sleep study results. Pt is agreeable to a follow up appt. An appt was made with pt for 07/25/2016 at 8:30am. Pt verbalized understanding of results. Pt had no questions at this time but was encouraged to call back if questions arise.

## 2016-06-10 NOTE — Telephone Encounter (Signed)
-----   Message from Star Age, MD sent at 06/07/2016  1:21 PM EST ----- Patient referred by Dr. Ardeth Perfect, seen by me on 05/22/16, diagnostic PSG on 06/04/16.   Please call and notify the patient that the recent sleep study did not show any significant obstructive sleep apnea: an overall high normal AHI of 4.2/hour, mild obstructive sleep apnea during REM sleep with a  REM AHI of 9.4/hour, and moderate supine sleep apnea with a supine AHI of 24.4/hour, and O2 nadir of 90%. CPAP therapy is not warranted, but it may help to sleep off the back and lose some  weight. This may also help the mild to moderate snoring. He had moderate PLMs (periodic limb movements of sleep). Patient does endorse RLS symptoms. Please inform patient that I would like to go over the details of the study during a follow up appointment. Arrange a followup appointment. Also, route or fax report to PCP and referring MD, if other than PCP.  Once you have spoken to patient, you can close this encounter.   Thanks,  Star Age, MD, PhD [facility])

## 2016-06-10 NOTE — Progress Notes (Signed)
Subjective: Patient presents today status post arthroplasty to the fourth digit right foot. Date of surgery 05/07/2016. Patient is doing well. Surgery was performed by Dr. Paulla Dolly.  Objective: Skin incisions are well coapted with sutures intact. No sign of infectious process noted.  Assessment: Status post arthroplasty fourth digit right foot. Date of surgery 05/07/2016.  Plan of care: Patient was evaluated today. Sutures are removed. Patient can begin wearing regular shoe gear in 1 week. Return to clinic in 2 weeks.  Edrick Kins, DPM [facility]  Dr. Edrick Kins, [place]                                        [place], [place] [address]                Office [phone]  Fax [phone]

## 2016-06-10 NOTE — Telephone Encounter (Signed)
I called pt. I advised him of his sleep study results. Pt is agreeable to a follow up appt to go over the details of the study. An appt was made for 07/25/2016 at 8:30am. Pt verbalized understanding of results. Pt asked that I send a copy of these results to Dr. Ardeth Perfect. Pt had no questions at this time but was encouraged to call back if questions arise.

## 2016-06-10 NOTE — Telephone Encounter (Signed)
-----   Message from Star Age, MD sent at 06/07/2016  1:21 PM EST ----- Patient referred by Dr. Ardeth Perfect, seen by me on 05/22/16, diagnostic PSG on 06/04/16.   Please call and notify the patient that the recent sleep study did not show any significant obstructive sleep apnea: an overall high normal AHI of 4.2/hour, mild obstructive sleep apnea during REM sleep with a  REM AHI of 9.4/hour, and moderate supine sleep apnea with a supine AHI of 24.4/hour, and O2 nadir of 90%. CPAP therapy is not warranted, but it may help to sleep off the back and lose some  weight. This may also help the mild to moderate snoring. He had moderate PLMs (periodic limb movements of sleep). Patient does endorse RLS symptoms. Please inform patient that I would like to go over the details of the study during a follow up appointment. Arrange a followup appointment. Also, route or fax report to PCP and referring MD, if other than PCP.  Once you have spoken to patient, you can close this encounter.   Thanks,  Star Age, MD, PhD [facility])

## 2016-06-12 ENCOUNTER — Ambulatory Visit (INDEPENDENT_AMBULATORY_CARE_PROVIDER_SITE_OTHER): Payer: Medicare Other

## 2016-06-12 ENCOUNTER — Ambulatory Visit (INDEPENDENT_AMBULATORY_CARE_PROVIDER_SITE_OTHER): Payer: Medicare Other | Admitting: Podiatry

## 2016-06-12 DIAGNOSIS — M2041 Other hammer toe(s) (acquired), right foot: Secondary | ICD-10-CM

## 2016-06-12 NOTE — Progress Notes (Signed)
Subjective:     Patient ID: Steven Mcknight, male   DOB: 1946/03/07, 71 y.o.   MRN: [id number]  HPI patient states I'm doing well with my toe   Review of Systems     Objective:   Physical Exam Neurovascular status intact with fourth digit distal right doing well with mild edema noted but minimal discomfort    Assessment:     Doing well from hammertoe repair fourth right    Plan:     Discussed the continuation of wider shoes padding therapy and reviewed final x-rays. Reappoint to recheck  X-ray report indicated good resection of bone had a middle phalanx right with good alignment of the toe noted

## 2016-07-03 ENCOUNTER — Encounter: Payer: Self-pay | Admitting: Internal Medicine

## 2016-07-25 ENCOUNTER — Ambulatory Visit: Payer: Self-pay | Admitting: Neurology

## 2016-08-12 ENCOUNTER — Ambulatory Visit (AMBULATORY_SURGERY_CENTER): Payer: Self-pay | Admitting: *Deleted

## 2016-08-12 VITALS — Ht 70.0 in | Wt 181.0 lb

## 2016-08-12 DIAGNOSIS — Z86010 Personal history of colonic polyps: Secondary | ICD-10-CM

## 2016-08-12 MED ORDER — NA SULFATE-K SULFATE-MG SULF 17.5-3.13-1.6 GM/177ML PO SOLN: 1.0000 | Freq: Once | ORAL | 0 refills | Status: AC

## 2016-08-12 NOTE — Progress Notes (Signed)
No egg or soy allergy known to patient  No issues with past sedation with any surgeries  or procedures, no intubation problems  No diet pills per patient No home 02 use per patient  No blood thinners per patient  Pt denies issues with constipation  No A fib or A flutter  EMMI video sent to pt's e mail  

## 2016-08-21 ENCOUNTER — Encounter: Payer: Medicare Other | Admitting: Internal Medicine

## 2016-09-17 ENCOUNTER — Encounter: Payer: Medicare Other | Admitting: Internal Medicine

## 2016-10-07 ENCOUNTER — Encounter: Payer: Self-pay | Admitting: Neurology

## 2016-10-07 ENCOUNTER — Ambulatory Visit (INDEPENDENT_AMBULATORY_CARE_PROVIDER_SITE_OTHER): Payer: Medicare Other | Admitting: Neurology

## 2016-10-07 VITALS — BP 146/88 | HR 80 | Ht 70.0 in | Wt 184.0 lb

## 2016-10-07 DIAGNOSIS — G2581 Restless legs syndrome: Secondary | ICD-10-CM

## 2016-10-07 DIAGNOSIS — G479 Sleep disorder, unspecified: Secondary | ICD-10-CM

## 2016-10-07 DIAGNOSIS — G4761 Periodic limb movement disorder: Secondary | ICD-10-CM

## 2016-10-07 DIAGNOSIS — R799 Abnormal finding of blood chemistry, unspecified: Secondary | ICD-10-CM

## 2016-10-07 MED ORDER — ROPINIROLE HCL 0.25 MG PO TABS: ORAL_TABLET | ORAL | 5 refills | Status: DC

## 2016-10-07 NOTE — Progress Notes (Signed)
Subjective:    Patient ID: Steven Mcknight is a 71 y.o. male.  HPI     Interim history:   Steven Mcknight is a 71 year old right-handed gentleman with an underlying medical history of hyperlipidemia, hyperbilirubinemia, anxiety, overweight state, reflux disease, and recent right fourth hammertoe repair on 05/07/2016, who returns for follow-up consultation of his sleep disturbance, after sleep study testing. The patient is unaccompanied today. I first met him on 05/22/2016 at the request of his primary care physician, at which time he reported snoring and daytime somnolence as well as occasional morning headaches. He did endorse some restless leg syndrome type symptoms occasionally as well. I invited him for sleep study testing. He had a baseline sleep study on 06/04/2016. I went over his test results with him in detail today. Sleep efficiency was 62.1%, sleep latency was prolonged at 78.5 minutes, REM latency was normal at 91 minutes. He had an increased amount of wake after sleep onset of 113 minutes with several longer periods of wakefulness. He had absence of slow-wave sleep, increase of stage II sleep and REM sleep was mildly reduced at 15.5%. He had mild to moderate snoring, EKG showed rare PVCs. Overall AHI was high normal at 4.2 per hour, REM AHI was mildly elevated at 9.4 per hour, supine AHI was 24.4 per hour, average oxygen saturation was 98%, nadir was 90%. 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  Today, 10/07/2016 (all dictated new, as well as above notes, some dictation done in note pad or Word, outside of chart, may appear as copied):  He reports not waking up fully rested, his sleep is interrupted. He does have some restless leg symptoms and his wife has noted his leg twitching. He does not have a history of anemia but has had issues with his hemorrhoids and blood in the stool when hemorrhoids flareup. He had a checkup with cardiology in the  past and saw Dr. Einar Gip, who suggested he start taking a baby aspirin and increase his Lipitor.  The patient's allergies, current medications, family history, past medical history, past social history, past surgical history and problem list were reviewed and updated as appropriate.   Previously (copied from previous notes for reference):   05/22/2016: He reports snoring and excessive daytime somnolence, occasional morning headaches. I reviewed your office note from 04/29/2016. Of note, his Epworth sleepiness score is 11 out of 24 today, his fatigue score is 36 out of 63. He is retired, he lives with his wife, has 2 grown children in [place], stepchild in [place] as well. He quit smoking 10 years ago and drinks alcohol one drink/day, occasional cigar, usually no day-to-day caffeine, typically decaf coffee or half-and-half in the morning. He tries to get enough sleep, goes to bed between 9 PM and 11 PM typically, wakeup time between 7 and 7:30 AM but does not typically wake up rested, does not have night to night nocturia, has occasional jerking movements in his sleep, particularly with leg jerking, endorses occasional restless leg symptoms. He has occasional morning headaches, no personal history of migraine headaches. He does have nocturnal reflux symptoms and postnasal drip. He uses Xanax as needed, not daily, he is on a daily low-dose aspirin, atorvastatin and Nexium for his reflux.    He has a Hx of RLL lung nodules. Being monitored for this. I reviewed his CT chest report from January 2017, I understand he has to be scheduled for a  repeat scan to monitor. His weight has remained fairly stable. He exercises regularly. He is retired from [employer].   His Past Medical History Is Significant For: Past Medical History:  Diagnosis Date  . Allergy   . Anxiety   . Cataract    both eyes- removed  . GERD (gastroesophageal reflux disease)   . Hx of colonic polyps    Colonoscopy's in [place]   .  Hyperlipemia     His Past Surgical History Is Significant For: Past Surgical History:  Procedure Laterality Date  . APPENDECTOMY    . CATARACT EXTRACTION, BILATERAL    . FOOT SURGERY    . HERNIA REPAIR    . KNEE SURGERY    . NASAL SINUS SURGERY      His Family History Is Significant For: Family History  Problem Relation Age of Onset  . Melanoma Father   . Colon cancer Neg Hx   . Colon polyps Neg Hx   . Rectal cancer Neg Hx   . Stomach cancer Neg Hx   . Esophageal cancer Neg Hx     His Social History Is Significant For: Social History   Social History  . Marital status: Married    Spouse name: N/A  . Number of children: 2  . Years of education: BS   Occupational History  . Retired    Social History Main Topics  . Smoking status: Former Research scientist (life sciences)  . Smokeless tobacco: Never Used  . Alcohol use Yes     Comment: one daily   . Drug use: No  . Sexual activity: Not Asked   Other Topics Concern  . None   Social History Narrative   Daily caffeine          His Allergies Are:  No Known Allergies:   His Current Medications Are:  Outpatient Encounter Prescriptions as of 10/07/2016  Medication Sig  . ALPRAZolam (XANAX) 0.25 MG tablet Take 0.25 mg by mouth as needed for anxiety.  Marland Kitchen aspirin 81 MG tablet Take 81 mg by mouth daily.  Marland Kitchen atorvastatin (LIPITOR) 40 MG tablet Take 40 mg by mouth daily.  Marland Kitchen OVER THE COUNTER MEDICATION Place 2 sprays into both nostrils as needed (for congestion). OTC [employer] Nasal Spray  . [DISCONTINUED] esomeprazole (NEXIUM) 20 MG capsule Take 1 capsule (20 mg total) by mouth every other day. (Patient taking differently: Take 20 mg by mouth daily. )   No facility-administered encounter medications on file as of 10/07/2016.   :  Review of Systems:  Out of a complete 14 point review of systems, all are reviewed and negative with the exception of these symptoms as listed below: Review of Systems  Neurological:       Pt presents today to follow up  with sleep study results. Has no other complaints at this time     Objective:  Neurologic Exam  Physical Exam Physical Examination:   Vitals:   10/07/16 1456  BP: (!) 146/88  Pulse: 80   General Examination: The patient is a very pleasant 71 y.o. male in no acute distress. He appears well-developed and well-nourished and well groomed.   HEENT: Normocephalic, atraumatic, pupils are equal, round and reactive to light and accommodation. Status post bilateral cataract repairs. Extraocular tracking is good. Hearing is grossly intact. Face is symmetric with normal facial animation and normal facial sensation. Speech is clear with no dysarthria noted. There is no hypophonia. There is no lip, neck/head, jaw or voice tremor. Oropharynx exam reveals:  mild mouth dryness, adequate dental hygiene and mild airway crowding. Tongue protrudes centrally and palate elevates symmetrically.   Chest: Clear to auscultation without wheezing, rhonchi or crackles noted with the exception of mild crackles noted at the right lung base.  Heart: S1+S2+0, regular and normal without murmurs, rubs or gallops noted. Slower heart rate.  Abdomen: Soft, non-tender and non-distended with normal bowel sounds appreciated on auscultation.  Extremities: There is no pitting edema in the distal lower extremities bilaterally. Pedal pulses are intact.  Skin: Warm and dry without trophic changes noted.  Musculoskeletal: exam reveals no obvious joint deformities, tenderness or joint swelling or erythema.   Neurologically:  Mental status: The patient is awake, alert and oriented in all 4 spheres. His immediate and remote memory, attention, language skills and fund of knowledge are appropriate. There is no evidence of aphasia, agnosia, apraxia or anomia. Speech is clear with normal prosody and enunciation. Thought process is linear. Mood is normal and affect is normal.  Cranial nerves II - XII are as described above under  HEENT exam.  Motor exam: Normal bulk, strength and tone is noted. There is no drift, tremor or rebound. Romberg is negative. Fine motor skills and coordination: grossly intact.  Cerebellar testing: No dysmetria or intention tremor. There is no truncal or gait ataxia.  Sensory exam: intact to light touch.  Gait, station and balance: He stands easily. No veering to one side is noted. No leaning to one side is noted. Posture is age-appropriate and stance is narrow based. Gait shows normal stride length and normal pace. No problems turning are noted. Tandem walk is unremarkable.  Assessment and plan:  In summary, Steven Mcknight is a very pleasant 71 year old male with an underlying medical history of hyperlipidemia, hyperbilirubinemia, anxiety, overweight state, reflux disease, and s/p hammertoe repair in Jan 2018, who Presents for follow-up consultation of his sleep disturbance, after recent sleep study testing on 06/11/2016. 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 He has history of restless leg symptoms and leg jerking at night and his sleep study did show moderate PLMS with some arousals. Sleep overall was quite fragmented in several longer periods of wakefulness were noted. He does not have a telltale history of anemia, nevertheless, we will check ferritin level, TIBC and iron status today, we will call him with his test results. In addition, I talked to him about medication for RLS and PLMS. He is agreeable to trying medication. I suggested Requip 0.25 mg strength generic with gradual titration. I talked to him about medication side effects, titration and expectations. He is advised to take the medication 90 minutes to 120 minutes before protective bedtime which is around 9:30 or 10 PM typically. Physical exam is  stable and nonfocal. I suggested 4 month checkup, sooner as needed. He is encouraged to call or email in the interim for any questions or concerns and we will call him with his blood test results in the interim. I answered all his questions today and he was in agreement with the plan.  I spent 25 minutes in total face-to-face time with the patient, more than 50% of which was spent in counseling and coordination of care, reviewing test results, reviewing medication and discussing or reviewing the diagnosis of PLMD, RLS, the prognosis and treatment  options. Pertinent laboratory and imaging test results that were available during this visit with the patient were reviewed by me and considered in my medical decision making (see chart for details).

## 2016-10-07 NOTE — Patient Instructions (Signed)
Your sleep study did not show any significant obstructive sleep apnea, only mild if your sleep on your back. You had moderate leg twitching in sleep, which did disturb your sleep some.   Keep in mind restless legs syndrome (RLS) is associated with anemia and iron deficiency. We will check storage iron with a blood test today.   Please remember to try to maintain good sleep hygiene, which means: Keep a regular sleep and wake schedule, try not to exercise or have a meal within 2 hours of your bedtime, try to keep your bedroom conducive for sleep, that is, cool and dark, without light distractors such as an illuminated alarm clock, and refrain from watching TV right before sleep or in the middle of the night and do not keep the TV or radio on during the night. Also, try not to use or play on electronic devices at bedtime, such as your cell phone, tablet PC or laptop. If you like to read at bedtime on an electronic device, try to dim the background light as much as possible. Do not eat in the middle of the night.   For your restless legs and leg movements: let's try you on: Requip (generic name: ropinirole) 0.25 mg: Take one pill each night 1 week, then 2 pills each night for 1 week, then 3 pills each night for 1 week, then 4 pills each night thereafter. Take 90-120 minutes before projected bedtime. Common side effects reported are: Sedation, sleepiness, nausea, vomiting, and rare side effects are confusion, hallucinations, swelling in legs, and abnormal behaviors, including impulse control problems, which can manifest as excessive eating, obsessions with food or gambling, or hypersexuality.

## 2016-10-08 ENCOUNTER — Telehealth: Payer: Self-pay | Admitting: *Deleted

## 2016-10-08 LAB — IRON AND TIBC
IRON SATURATION: 43 % (ref 15–55)
Iron: 131 ug/dL (ref 38–169)
Total Iron Binding Capacity: 308 ug/dL (ref 250–450)
UIBC: 177 ug/dL (ref 111–343)

## 2016-10-08 LAB — FERRITIN: FERRITIN: 61 ng/mL (ref 30–400)

## 2016-10-08 NOTE — Telephone Encounter (Signed)
Spoke with patient and informed him his Iron studies are okay. Advised Dr Rexene Alberts wants him to proceed as discussed with Requip titration. Patient stated he knew what to do, started taking it last night. He verbalized understanding, appreciation of call, had no questions.

## 2016-10-08 NOTE — Progress Notes (Signed)
Iron studies okay, proceed as discussed with Requip titration.  Please call patient to notify.  Star Age, MD, PhD [facility])

## 2016-10-28 ENCOUNTER — Ambulatory Visit (AMBULATORY_SURGERY_CENTER): Payer: Medicare Other | Admitting: Internal Medicine

## 2016-10-28 ENCOUNTER — Encounter: Payer: Self-pay | Admitting: Internal Medicine

## 2016-10-28 VITALS — BP 128/77 | HR 49 | Temp 99.3°F | Resp 10 | Ht 70.0 in | Wt 181.0 lb

## 2016-10-28 DIAGNOSIS — D120 Benign neoplasm of cecum: Secondary | ICD-10-CM | POA: Diagnosis not present

## 2016-10-28 DIAGNOSIS — D122 Benign neoplasm of ascending colon: Secondary | ICD-10-CM | POA: Diagnosis not present

## 2016-10-28 DIAGNOSIS — Z860100 Personal history of colon polyps, unspecified: Secondary | ICD-10-CM

## 2016-10-28 MED ORDER — SODIUM CHLORIDE 0.9 % IV SOLN: 500.0000 mL | INTRAVENOUS | Status: AC

## 2016-10-28 NOTE — Progress Notes (Signed)
To PACU VSS. Report To RN.tb

## 2016-10-28 NOTE — Op Note (Signed)
[facility] Patient Name: Steven Mcknight Procedure Date: 10/28/2016 1:44 PM MRN: [id number] Endoscopist: Docia Chuck. Steven Mcknight , MD Age: 71 Referring MD:  Date of Birth: 1945/12/17 Gender: Male Account #: [id number] Procedure:                Colonoscopy, with cold snare polypectomy x 2 Indications:              High risk colon cancer surveillance: Personal                            history of colonic polyps. Reports having had                            "polyps" index colonoscopy elsewhere (no record).                            Multiple colonoscopy since without polyps. Last                            examinations in [place], 2009, and 2013 were                            negative for neoplasia Medicines:                Monitored Anesthesia Care Procedure:                Pre-Anesthesia Assessment:                           - Prior to the procedure, a History and Physical                            was performed, and patient medications and                            allergies were reviewed. The patient's tolerance of                            previous anesthesia was also reviewed. The risks                            and benefits of the procedure and the sedation                            options and risks were discussed with the patient.                            All questions were answered, and informed consent                            was obtained. Prior Anticoagulants: The patient has                            taken no previous anticoagulant or antiplatelet  agents. ASA Grade Assessment: II - A patient with                            mild systemic disease. After reviewing the risks                            and benefits, the patient was deemed in                            satisfactory condition to undergo the procedure.                           After obtaining informed consent, the colonoscope                            was passed under direct  vision. Throughout the                            procedure, the patient's blood pressure, pulse, and                            oxygen saturations were monitored continuously. The                            Colonoscope was introduced through the anus and                            advanced to the the cecum, identified by                            appendiceal orifice and ileocecal valve. The                            ileocecal valve, appendiceal orifice, and rectum                            were photographed. The quality of the bowel                            preparation was excellent. The colonoscopy was                            performed without difficulty. The patient tolerated                            the procedure well. The bowel preparation used was                            SUPREP. Scope In: 1:50:55 PM Scope Out: 2:03:29 PM Scope Withdrawal Time: 0 hours 10 minutes 56 seconds  Total Procedure Duration: 0 hours 12 minutes 34 seconds  Findings:                 Two polyps were found in the ascending colon and  cecum. The polyps were 1 mm in size. These polyps                            were removed with a cold snare. Resection and                            retrieval were complete.                           A few small-mouthed diverticula were found in the                            left colon.                           Internal hemorrhoids were found during retroflexion.                           The exam was otherwise without abnormality on                            direct and retroflexion views. Complications:            No immediate complications. Estimated blood loss:                            None. Estimated Blood Loss:     Estimated blood loss: none. Impression:               - Two 1 mm polyps in the ascending colon and in the                            cecum, removed with a cold snare. Resected and                            retrieved.                            - Diverticulosis in the left colon.                           - Internal hemorrhoids.                           - The examination was otherwise normal on direct                            and retroflexion views. Recommendation:           - Repeat colonoscopy in 5 years for surveillance if                            polyps adenomatous. Otherwise, no further                            surveillance required.                           -  Patient has a contact number available for                            emergencies. The signs and symptoms of potential                            delayed complications were discussed with the                            patient. Return to normal activities tomorrow.                            Written discharge instructions were provided to the                            patient.                           - Resume previous diet.                           - Continue present medications.                           - Await pathology results. Docia Chuck. Steven Pastor, MD 10/28/2016 2:09:36 PM This report has been signed electronically.

## 2016-10-28 NOTE — Progress Notes (Signed)
Called to room to assist during endoscopic procedure.  Patient ID and intended procedure confirmed with present staff. Received instructions for my participation in the procedure from the performing physician.  

## 2016-10-28 NOTE — Progress Notes (Signed)
Pt's states no medical or surgical changes since previsit or office visit. 

## 2016-10-28 NOTE — Patient Instructions (Signed)
YOU HAD AN ENDOSCOPIC PROCEDURE TODAY AT [facility]:   Refer to the procedure report that was given to you for any specific questions about what was found during the examination.  If the procedure report does not answer your questions, please call your gastroenterologist to clarify.  If you requested that your care partner not be given the details of your procedure findings, then the procedure report has been included in a sealed envelope for you to review at your convenience later.  YOU SHOULD EXPECT: Some feelings of bloating in the abdomen. Passage of more gas than usual.  Walking can help get rid of the air that was put into your GI tract during the procedure and reduce the bloating. If you had a lower endoscopy (such as a colonoscopy or flexible sigmoidoscopy) you may notice spotting of blood in your stool or on the toilet paper. If you underwent a bowel prep for your procedure, you may not have a normal bowel movement for a few days.  Please Note:  You might notice some irritation and congestion in your nose or some drainage.  This is from the oxygen used during your procedure.  There is no need for concern and it should clear up in a day or so.  SYMPTOMS TO REPORT IMMEDIATELY:   Following lower endoscopy (colonoscopy or flexible sigmoidoscopy):  Excessive amounts of blood in the stool  Significant tenderness or worsening of abdominal pains  Swelling of the abdomen that is new, acute  Fever of 100F or higher  For urgent or emergent issues, a gastroenterologist can be reached at any hour by calling [phone].  DIET:  We do recommend a small meal at first, but then you may proceed to your regular diet.  Drink plenty of fluids but you should avoid alcoholic beverages for 24 hours.  ACTIVITY:  You should plan to take it easy for the rest of today and you should NOT DRIVE or use heavy machinery until tomorrow (because of the sedation medicines used during the test).     FOLLOW UP: Our staff will call the number listed on your records the next business day following your procedure to check on you and address any questions or concerns that you may have regarding the information given to you following your procedure. If we do not reach you, we will leave a message.  However, if you are feeling well and you are not experiencing any problems, there is no need to return our call.  We will assume that you have returned to your regular daily activities without incident.  If any biopsies were taken you will be contacted by phone or by letter within the next 1-3 weeks.  Please call us at [phone] if you have not heard about the biopsies in 3 weeks.   SIGNATURES/CONFIDENTIALITY: You and/or your care partner have signed paperwork which will be entered into your electronic medical record.  These signatures attest to the fact that that the information above on your After Visit Summary has been reviewed and is understood.  Full responsibility of the confidentiality of this discharge information lies with you and/or your care-partner.  Please read over handouts about polyps, diverticulosis, hemorrhoids and high fiber diets  Await pathology  Continue your normal medications

## 2016-10-29 ENCOUNTER — Telehealth: Payer: Self-pay | Admitting: *Deleted

## 2016-10-29 NOTE — Telephone Encounter (Signed)
  Follow up Call-  Call back number 10/28/2016  Post procedure Call Back phone  # [phone]  Permission to leave phone message Yes  Some recent data might be hidden     Patient questions:  Do you have a fever, pain , or abdominal swelling? No. Pain Score  0 *  Have you tolerated food without any problems? Yes.    Have you been able to return to your normal activities? Yes.    Do you have any questions about your discharge instructions: Diet   No. Medications  No. Follow up visit  No.  Do you have questions or concerns about your Care? No.  Actions: * If pain score is 4 or above: No action needed, pain <4.

## 2016-10-31 ENCOUNTER — Encounter: Payer: Self-pay | Admitting: Internal Medicine

## 2017-02-10 ENCOUNTER — Encounter: Payer: Self-pay | Admitting: Neurology

## 2017-02-10 ENCOUNTER — Ambulatory Visit (INDEPENDENT_AMBULATORY_CARE_PROVIDER_SITE_OTHER): Payer: Medicare Other | Admitting: Neurology

## 2017-02-10 VITALS — BP 151/92 | HR 67 | Ht 70.0 in | Wt 182.0 lb

## 2017-02-10 DIAGNOSIS — G4761 Periodic limb movement disorder: Secondary | ICD-10-CM

## 2017-02-10 DIAGNOSIS — G2581 Restless legs syndrome: Secondary | ICD-10-CM

## 2017-02-10 NOTE — Progress Notes (Signed)
Subjective:    Patient ID: Steven Mcknight is a 71 y.o. male.  HPI     Interim history:   Mr. Partington is a 71 year old right-handed gentleman with an underlying medical history of hyperlipidemia, hyperbilirubinemia, anxiety, overweight state, reflux disease, and recent right fourth hammertoe repair on 05/07/2016, who presents for follow-up consultation of his sleep disorder, after starting a trial of Requip for restless legs and PLMs. The patient is unaccompanied today. I last saw him on 10/07/2016, at which time he reported not waking up fully rested, sleep is interrupted. He noted some restless leg symptoms and his wife had noted his leg twitching. He was advised to increase his Lipitor and start a baby aspirin by his cardiologist recently. We checked blood work including iron and TIBC, ferritin at the time which were normal. I suggested he start a trial of Requip.   Today, 02/10/2017: He reports doing okay on Requip, feels that it helps, takes it prn, only one pill, and tries to take it Nightly, but sometimes forgets it. He has side effects from it. He takes 0.25 mg each evening on most nights he feels like it needs to be adjusted but so far he feels like the lower dose has helped in the sense that hoes not wake up as many times at night and does not feel as restless.   The patient's allergies, current medications, family history, past medical history, past social history, past surgical history and problem list were reviewed and updated as appropriate.    Previously (copied from previous notes for reference):   I first met him on 05/22/2016 at the request of his primary care physician, at which time he reported snoring and daytime somnolence as well as occasional morning headaches. He did endorse some restless leg syndrome type symptoms occasionally as well. I invited him for sleep study testing. He had a baseline sleep study on 06/04/2016. I went over his test results with him in detail today. Sleep  efficiency was 62.1%, sleep latency was prolonged at 78.5 minutes, REM latency was normal at 91 minutes. He had an increased amount of wake after sleep onset of 113 minutes with several longer periods of wakefulness. He had absence of slow-wave sleep, increase of stage II sleep and REM sleep was mildly reduced at 15.5%. He had mild to moderate snoring, EKG showed rare PVCs. Overall AHI was high normal at 4.2 per hour, REM AHI was mildly elevated at 9.4 per hour, supine AHI was 24.4 per hour, average oxygen saturation was 98%, nadir was 90%. 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    05/22/2016: He reports snoring and excessive daytime somnolence, occasional morning headaches. I reviewed your office note from 04/29/2016. Of note, his Epworth sleepiness score is 11 out of 24 today, his fatigue score is 36 out of 63. He is retired, he lives with his wife, has 2 grown children in [place], stepchild in [place] as well. He quit smoking 10 years ago and drinks alcohol one drink/day, occasional cigar, usually no day-to-day caffeine, typically decaf coffee or half-and-half in the morning. He tries to get enough sleep, goes to bed between 9 PM and 11 PM typically, wakeup time between 7 and 7:30 AM but does not typically wake up rested, does not have night to night nocturia, has occasional jerking movements in his sleep, particularly with leg jerking, endorses occasional restless leg symptoms. He has occasional morning headaches, no personal  history of migraine headaches. He does have nocturnal reflux symptoms and postnasal drip. He uses Xanax as needed, not daily, he is on a daily low-dose aspirin, atorvastatin and Nexium for his reflux.    He has a Hx of RLL lung nodules. Being monitored for this. I reviewed his CT chest report from January 2017, I understand he has to be scheduled for a repeat scan to monitor. His weight has remained fairly stable. He exercises  regularly. He is retired from [employer].   His Past Medical History Is Significant For: Past Medical History:  Diagnosis Date  . Allergy   . Anxiety   . Cataract    both eyes- removed  . GERD (gastroesophageal reflux disease)   . Hx of colonic polyps    Colonoscopy's in [place]   . Hyperlipemia     His Past Surgical History Is Significant For: Past Surgical History:  Procedure Laterality Date  . APPENDECTOMY    . CATARACT EXTRACTION, BILATERAL    . FOOT SURGERY    . HERNIA REPAIR    . KNEE SURGERY    . NASAL SINUS SURGERY      His Family History Is Significant For: Family History  Problem Relation Age of Onset  . Melanoma Father   . Colon cancer Neg Hx   . Colon polyps Neg Hx   . Rectal cancer Neg Hx   . Stomach cancer Neg Hx   . Esophageal cancer Neg Hx     His Social History Is Significant For: Social History   Social History  . Marital status: Married    Spouse name: N/A  . Number of children: 2  . Years of education: BS   Occupational History  . Retired    Social History Main Topics  . Smoking status: Former Research scientist (life sciences)  . Smokeless tobacco: Never Used  . Alcohol use Yes     Comment: one daily   . Drug use: No  . Sexual activity: Not Asked   Other Topics Concern  . None   Social History Narrative   Daily caffeine           His Allergies Are:  No Known Allergies:   His Current Medications Are:  Outpatient Encounter Prescriptions as of 02/10/2017  Medication Sig  . ALPRAZolam (XANAX) 0.25 MG tablet Take 0.25 mg by mouth as needed for anxiety.  Marland Kitchen aspirin 81 MG tablet Take 81 mg by mouth daily.  Marland Kitchen atorvastatin (LIPITOR) 40 MG tablet Take 40 mg by mouth daily.  . Esomeprazole Magnesium (NEXIUM 24HR PO) Take 1 capsule by mouth daily.  Marland Kitchen OVER THE COUNTER MEDICATION Place 2 sprays into both nostrils as needed (for congestion). OTC [employer] Nasal Spray  . rOPINIRole (REQUIP) 0.25 MG tablet 1 pill nightly x 1 week, then 2 pills nightly x 1 w,  then 3 pills nightly x 1 w, then 4 pills nightly thereafter. 90-120 min before bedtime.   Facility-Administered Encounter Medications as of 02/10/2017  Medication  . 0.9 %  sodium chloride infusion  :  Review of Systems:  Out of a complete 14 point review of systems, all are reviewed and negative with the exception of these symptoms as listed below:  Review of Systems  Neurological:       Pt presents today to follow up on his RLS. Pt takes the requip as needed and he reports that it works.    Objective:  Neurological Exam  Physical Exam Physical Examination:   Vitals:  02/10/17 1355  BP: (!) 151/92  Pulse: 67   General Examination: The patient is a very pleasant 71 y.o. male in no acute distress. He appears well-developed and well-nourished and well groomed.   HEENT:Normocephalic, atraumatic, pupils are equal, round and reactive to light and accommodation. Status post bilateral cataract repairs. Extraocular tracking is good. Hearing is grossly intact. Face is symmetric with normal facial animation and normal facial sensation. Speech is clear with no dysarthria noted. There is no hypophonia. There is no lip, neck/head, jaw or voice tremor. Oropharynx exam reveals: mildmouth dryness, adequatedental hygiene and mildairway crowding. Tongue protrudes centrally and palate elevates symmetrically.   Chest:Clear to auscultation without wheezing, rhonchi or crackles noted.   Heart:S1+S2+0, regular and normal without murmurs, rubs or gallops noted. Slower heart rate.  Abdomen:Soft, non-tender and non-distended with normal bowel sounds appreciated on auscultation.  Extremities:There is nopitting edema in the distal lower extremities bilaterally. Pedal pulses are intact.  Skin: Warm and dry without trophic changes noted.  Musculoskeletal: exam reveals no obvious joint deformities, tenderness or joint swelling or erythema.   Neurologically:  Mental status: The patient is  awake, alert and oriented in all 4 spheres. Hisimmediate and remote memory, attention, language skills and fund of knowledge are appropriate. There is no evidence of aphasia, agnosia, apraxia or anomia. Speech is clear with normal prosody and enunciation. Thought process is linear. Mood is normaland affect is normal.  Cranial nerves II - XII are as described above under HEENT exam.  Motor exam: Normal bulk, strength and tone is noted. There is no drift, tremor or rebound. Fine motor skills and coordination: grossly intact.  Cerebellar testing: No dysmetria or intention tremor. There is no truncal or gait ataxia.  Sensory exam: intact to light touch.  Gait, station and balance: Hestands easily. No veering to one side is noted. No leaning to one side is noted. Posture is age-appropriate and stance is narrow based. Gait shows normalstride length and normalpace. No problems turning are noted.  Assessment and plan:  In summary, Shanna Strength a very pleasant 71 year old male with an underlying medical history of hyperlipidemia, hyperbilirubinemia, anxiety, overweight state, reflux disease, and s/p hammertoe repair in Jan 2018, who presents for follow-up consultation of his sleep disturbance. 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 He has a history of restless leg symptoms and leg jerking at night and his sleep study did show moderate PLMS with some arousals. Sleep overall was quite fragmented with several longer periods of wakefulness. We checked blood work which was unremarkable for ferritin level and TIBC and iron status. I suggested a trial of Requip 0.25 mg strength generic with gradual titration; he has been taking only 1 pill in the evenings, most nights and feels, it  has been helpful. I suggested that he continue with this medication, he can increase it if needed. He did not need a refill today. I suggested a 12 month FU. I answered all his questions today and he was in agreement with the plan.  I spent 20 minutes in total face-to-face time with the patient, more than 50% of which was spent in counseling and coordination of care, reviewing test results, reviewing medication and discussing or reviewing the diagnosis of PLMD/RLS, the prognosis and  treatment options. Pertinent laboratory and imaging test results that were available during this visit with the patient were reviewed by me and considered in my medical decision making (see chart for details).

## 2017-02-10 NOTE — Patient Instructions (Addendum)
Let's continue with the as needed requip; you can increase it and also take it nightly if you feel like you need it.   I can see you in a year.

## 2017-05-12 ENCOUNTER — Other Ambulatory Visit: Payer: Self-pay | Admitting: Internal Medicine

## 2017-05-12 DIAGNOSIS — R911 Solitary pulmonary nodule: Secondary | ICD-10-CM

## 2017-05-12 HISTORY — PX: INCISIONAL HERNIA REPAIR: SHX193

## 2017-05-19 ENCOUNTER — Ambulatory Visit
Admission: RE | Admit: 2017-05-19 | Discharge: 2017-05-19 | Disposition: A | Discharge status: Home / Self Care | Payer: Medicare Other | Source: Ambulatory Visit | Attending: Internal Medicine | Admitting: Internal Medicine

## 2017-05-19 DIAGNOSIS — R911 Solitary pulmonary nodule: Secondary | ICD-10-CM

## 2018-02-16 ENCOUNTER — Ambulatory Visit: Payer: Medicare Other | Admitting: Neurology

## 2018-02-26 ENCOUNTER — Other Ambulatory Visit: Payer: Self-pay | Admitting: Podiatry

## 2018-02-26 ENCOUNTER — Encounter: Payer: Self-pay | Admitting: Podiatry

## 2018-02-26 ENCOUNTER — Ambulatory Visit (INDEPENDENT_AMBULATORY_CARE_PROVIDER_SITE_OTHER): Payer: Medicare Other

## 2018-02-26 ENCOUNTER — Ambulatory Visit: Payer: Medicare Other | Admitting: Podiatry

## 2018-02-26 DIAGNOSIS — L84 Corns and callosities: Secondary | ICD-10-CM | POA: Diagnosis not present

## 2018-02-26 DIAGNOSIS — M779 Enthesopathy, unspecified: Secondary | ICD-10-CM

## 2018-02-26 DIAGNOSIS — M2041 Other hammer toe(s) (acquired), right foot: Secondary | ICD-10-CM

## 2018-02-26 DIAGNOSIS — M79671 Pain in right foot: Secondary | ICD-10-CM

## 2018-02-26 MED ORDER — TRIAMCINOLONE ACETONIDE 10 MG/ML IJ SUSP
10.0000 mg | Freq: Once | INTRAMUSCULAR | Status: AC
  Administered 2018-02-26: 10 mg

## 2018-03-01 NOTE — Progress Notes (Signed)
Subjective:   Patient ID: Steven Mcknight, male   DOB: 72 y.o.   MRN: [id number]   HPI Patient presents with significant discomfort of the third toe right foot with lesion formation and inability to wear shoe gear comfortably.  States it occurred over the last few months and is gradually worse and he is tried wider shoes and padding without relief.  Patient does not smoke and likes to be active   Review of Systems  All other systems reviewed and are negative.       Objective:  Physical Exam  Constitutional: He appears well-developed and well-nourished.  Cardiovascular: Intact distal pulses.  Pulmonary/Chest: Effort normal.  Musculoskeletal: Normal range of motion.  Neurological: He is alert.  Skin: Skin is warm.  Nursing note and vitals reviewed.   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  I then did sterile prep of the digit and I did careful inner phalangeal joint injection 1 mg Dexasone Kenalog 2 mg Xylocaine and did debridement of lesion with no iatrogenic bleeding and applied pad.  Reviewed hammertoe deformity and the consideration for arthroplasty bone removal which may be necessary at one point in future  X-ray indicates there is structural deformity of the toe with moderate elevation of the digit and compression against the second and fourth digits

## 2018-05-01 IMAGING — CT CT CHEST LUNG CANCER SCREENING LOW DOSE W/O CM
1 of 5 series · 15 of 40 positions shown, 19 images · non-contrast
Comparison: 05/10/2015.

CLINICAL DATA: Thirty-one pack-year history of smoking, quit 10
years ago, lung cancer screening.

EXAM:
CT CHEST WITHOUT CONTRAST LOW-DOSE FOR LUNG CANCER SCREENING
TECHNIQUE: Multidetector CT imaging of the chest was performed following the
standard protocol without IV contrast.

[Series 3: lung windows · axial · 0.70mm/px · z∈[-297,-8]mm · 15 of 257 slices shown, 19 images]
[im 13/257  mediastinal]
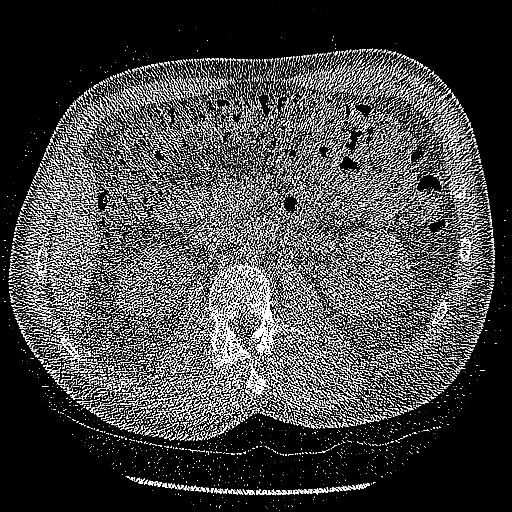
[im 13/257  lung]
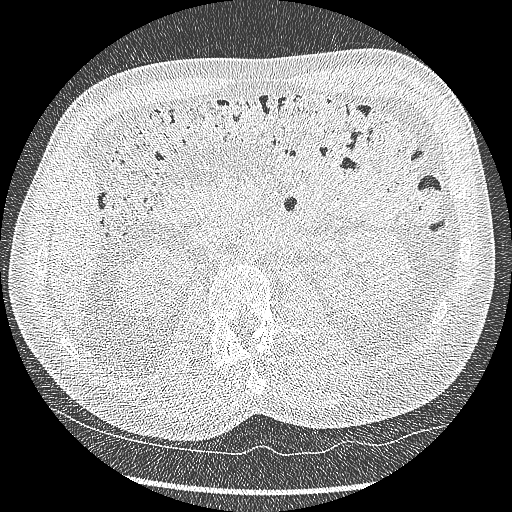
[im 26/257  lung]
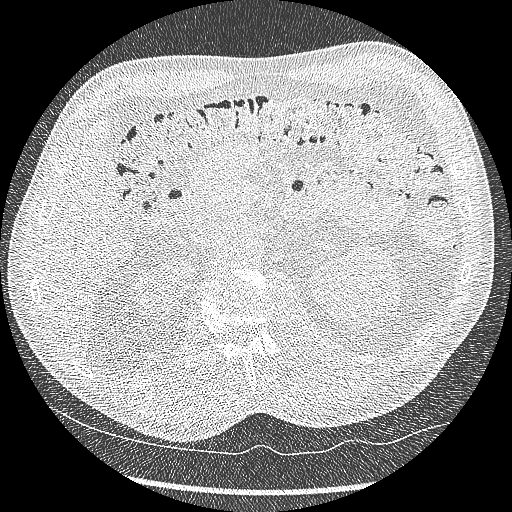
[im 52/257  lung]
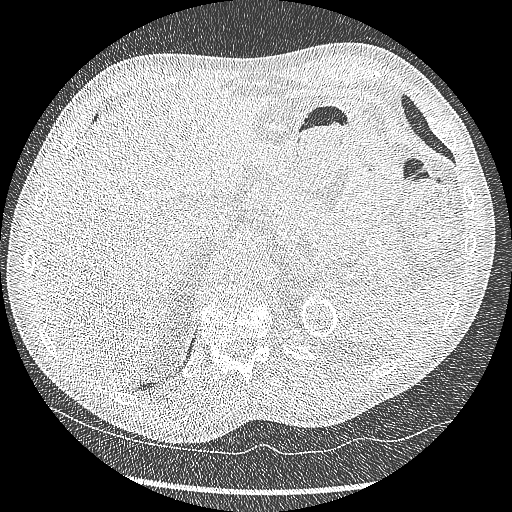
[im 65/257  lung]
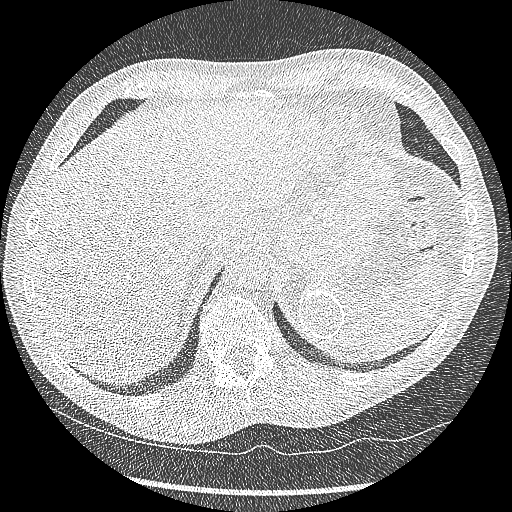
[im 77/257  mediastinal]
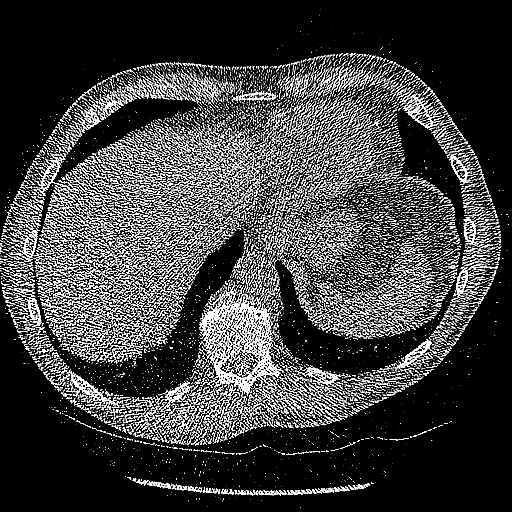
[im 77/257  lung]
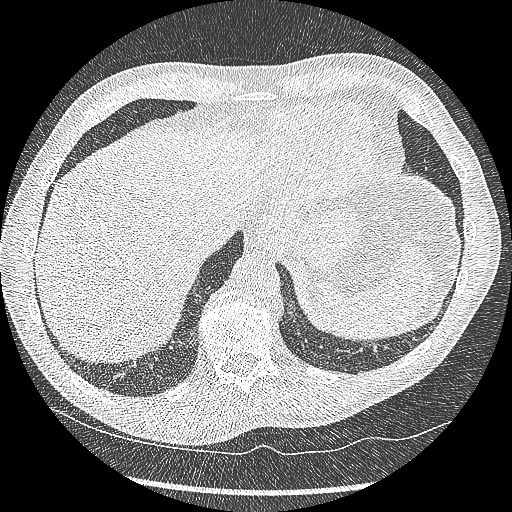
[im 90/257  lung]
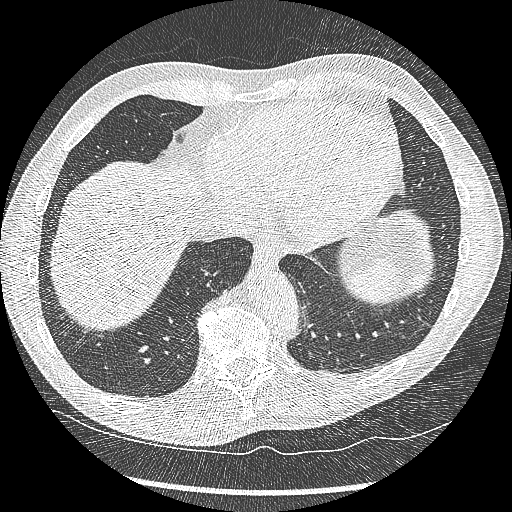
[im 116/257  lung]
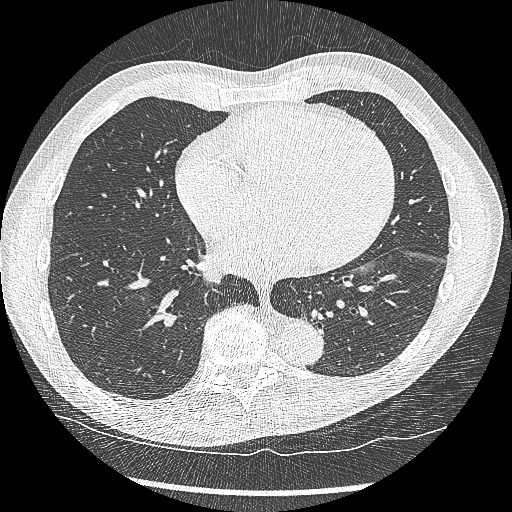
[im 129/257  lung]
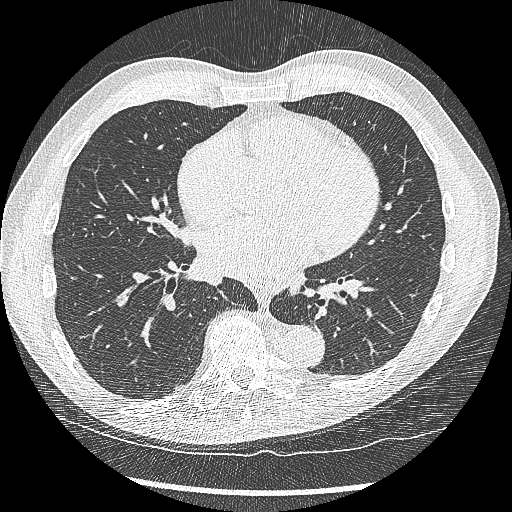
[im 141/257  mediastinal]
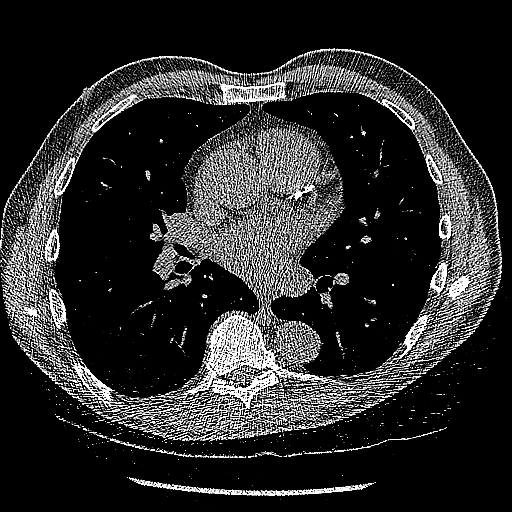
[im 141/257  lung]
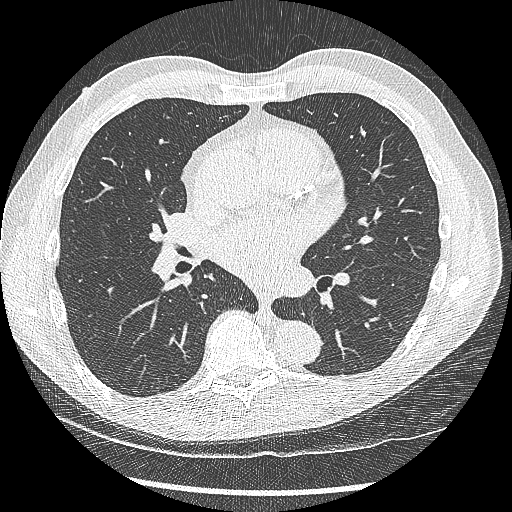
[im 167/257  lung]
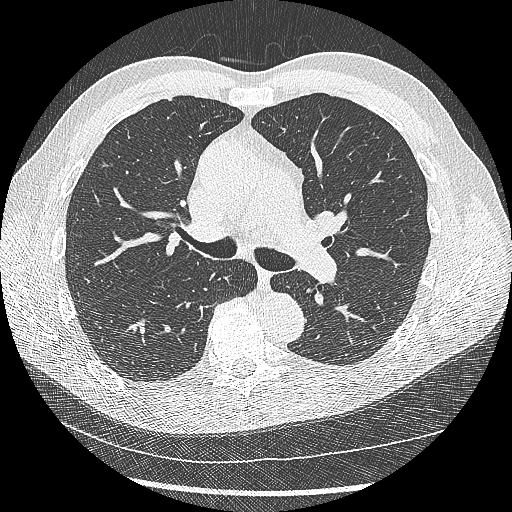
[im 180/257  lung]
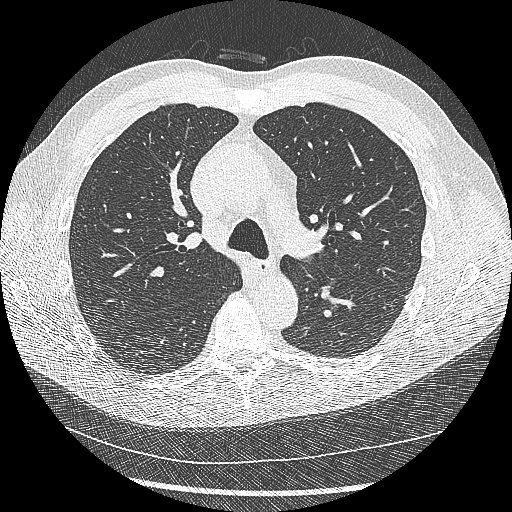
[im 193/257  lung]
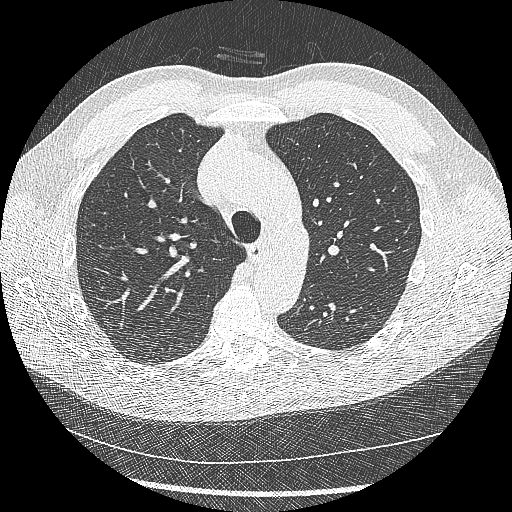
[im 205/257  mediastinal]
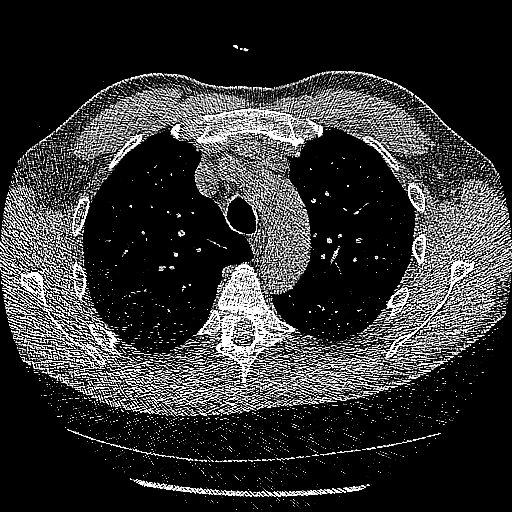
[im 205/257  lung]
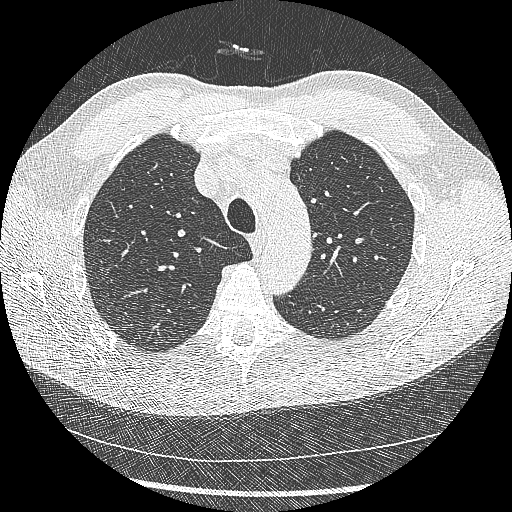
[im 231/257  lung]
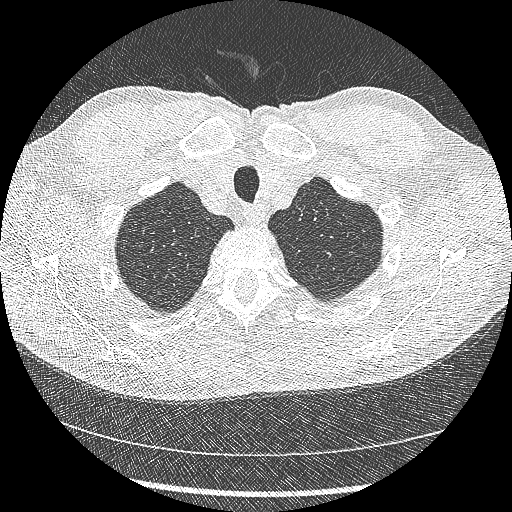
[im 244/257  lung]
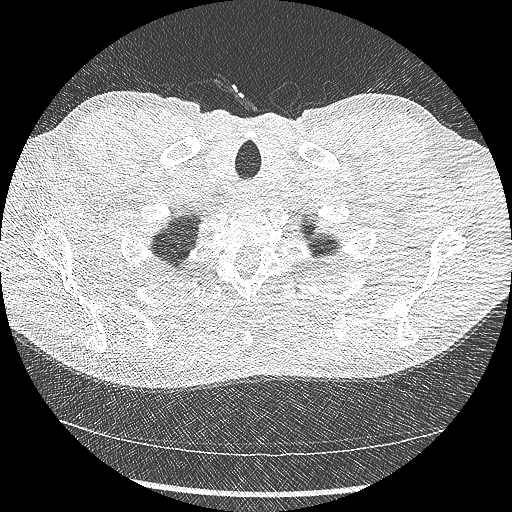

[15 of 40 positions shown; findings below may reference images not displayed]

FINDINGS: Cardiovascular: Atherosclerotic calcification of the arterial
vasculature, including coronary arteries. Heart is at the upper
limits of normal in size to mildly enlarged. No pericardial
effusion.

Mediastinum/Nodes: No pathologically enlarged mediastinal or
axillary lymph nodes. Hilar regions are difficult to evaluate
without IV contrast. Esophagus is grossly unremarkable.

Lungs/Pleura: Pulmonary nodules measure 5 mm or less in size. No
pleural fluid. Airway is unremarkable.

Upper Abdomen: Visualized portions of the liver, gallbladder,
adrenal glands and kidneys are unremarkable. Peripherally calcified
low-attenuation lesion in the medial aspect of the spleen measures
3.5 cm, stable and likely benign. Calcification is seen in the tail
of the pancreas, as before. Visualized portions of the stomach and
bowel are grossly unremarkable.

Musculoskeletal: No worrisome lytic or sclerotic lesions.
Degenerative changes are seen in the spine.
IMPRESSION: 1. Lung-RADS Category 2, benign appearance or behavior. Continue
annual screening with low-dose chest CT without contrast in 12
months.
2. Aortic atherosclerosis ([id number]-170.0). Coronary artery
calcification.

## 2018-05-27 ENCOUNTER — Other Ambulatory Visit: Payer: Self-pay | Admitting: Internal Medicine

## 2018-05-27 DIAGNOSIS — Z0000 Encounter for general adult medical examination without abnormal findings: Secondary | ICD-10-CM

## 2018-05-27 DIAGNOSIS — R918 Other nonspecific abnormal finding of lung field: Secondary | ICD-10-CM

## 2018-06-11 ENCOUNTER — Ambulatory Visit
Admission: RE | Admit: 2018-06-11 | Discharge: 2018-06-11 | Disposition: A | Discharge status: Home / Self Care | Payer: Medicare Other | Source: Ambulatory Visit | Attending: Internal Medicine | Admitting: Internal Medicine

## 2018-06-11 DIAGNOSIS — R918 Other nonspecific abnormal finding of lung field: Secondary | ICD-10-CM

## 2018-06-11 DIAGNOSIS — Z0000 Encounter for general adult medical examination without abnormal findings: Secondary | ICD-10-CM

## 2018-06-12 ENCOUNTER — Ambulatory Visit: Payer: Medicare Other

## 2018-07-02 ENCOUNTER — Other Ambulatory Visit: Payer: Self-pay | Admitting: Acute Care

## 2018-07-02 DIAGNOSIS — Z122 Encounter for screening for malignant neoplasm of respiratory organs: Secondary | ICD-10-CM

## 2018-07-02 DIAGNOSIS — Z87891 Personal history of nicotine dependence: Secondary | ICD-10-CM

## 2018-11-21 DIAGNOSIS — I10 Essential (primary) hypertension: Secondary | ICD-10-CM

## 2018-11-21 HISTORY — DX: Essential (primary) hypertension: I10

## 2018-11-21 HISTORY — PX: OTHER SURGICAL HISTORY: SHX169

## 2019-06-09 ENCOUNTER — Other Ambulatory Visit: Payer: Self-pay | Admitting: Internal Medicine

## 2019-06-09 DIAGNOSIS — Z87891 Personal history of nicotine dependence: Secondary | ICD-10-CM

## 2019-06-09 DIAGNOSIS — Z0000 Encounter for general adult medical examination without abnormal findings: Secondary | ICD-10-CM

## 2019-06-18 ENCOUNTER — Ambulatory Visit: Payer: Medicare Other

## 2019-06-19 ENCOUNTER — Ambulatory Visit: Payer: Medicare Other

## 2019-07-19 ENCOUNTER — Ambulatory Visit
Admission: RE | Admit: 2019-07-19 | Discharge: 2019-07-19 | Disposition: A | Discharge status: Home / Self Care | Payer: Medicare Other | Source: Ambulatory Visit | Attending: Internal Medicine | Admitting: Internal Medicine

## 2019-07-19 DIAGNOSIS — Z0000 Encounter for general adult medical examination without abnormal findings: Secondary | ICD-10-CM

## 2019-07-19 DIAGNOSIS — Z87891 Personal history of nicotine dependence: Secondary | ICD-10-CM

## 2019-08-10 ENCOUNTER — Telehealth: Payer: Self-pay | Admitting: Acute Care

## 2019-08-10 NOTE — Telephone Encounter (Signed)
Spoke with Lovena Le at Dr [facility] office and she clarified that per Dr Ardeth Perfect he will continue to monitor pt for yearly lung screening. Pt will be removed from our screening list.  Eric Form, NP is aware.

## 2019-08-10 NOTE — Telephone Encounter (Signed)
[place] x 1 for Dr [facility] nurse. I need to clarify for our records if Dr Ardeth Perfect is going to continue to follow pt for lung cancer screening.

## 2020-09-14 ENCOUNTER — Other Ambulatory Visit: Payer: Self-pay | Admitting: Internal Medicine

## 2020-09-14 DIAGNOSIS — Z87891 Personal history of nicotine dependence: Secondary | ICD-10-CM

## 2020-09-14 DIAGNOSIS — Z0000 Encounter for general adult medical examination without abnormal findings: Secondary | ICD-10-CM

## 2020-10-02 ENCOUNTER — Ambulatory Visit
Admission: RE | Admit: 2020-10-02 | Discharge: 2020-10-02 | Disposition: A | Discharge status: Home / Self Care | Payer: Medicare Other | Source: Ambulatory Visit | Attending: Internal Medicine | Admitting: Internal Medicine

## 2020-10-02 DIAGNOSIS — Z87891 Personal history of nicotine dependence: Secondary | ICD-10-CM

## 2020-10-02 DIAGNOSIS — Z0000 Encounter for general adult medical examination without abnormal findings: Secondary | ICD-10-CM

## 2020-11-20 ENCOUNTER — Ambulatory Visit: Payer: Medicare Other

## 2020-11-20 ENCOUNTER — Encounter: Payer: Self-pay | Admitting: Cardiology

## 2020-11-20 ENCOUNTER — Ambulatory Visit (INDEPENDENT_AMBULATORY_CARE_PROVIDER_SITE_OTHER): Payer: Medicare Other

## 2020-11-20 ENCOUNTER — Other Ambulatory Visit: Payer: Self-pay | Admitting: *Deleted

## 2020-11-20 ENCOUNTER — Other Ambulatory Visit (INDEPENDENT_AMBULATORY_CARE_PROVIDER_SITE_OTHER): Payer: Medicare Other

## 2020-11-20 ENCOUNTER — Ambulatory Visit: Payer: Medicare Other | Admitting: Cardiology

## 2020-11-20 ENCOUNTER — Other Ambulatory Visit: Payer: Self-pay

## 2020-11-20 VITALS — BP 160/90 | HR 52 | Ht 70.0 in | Wt 187.0 lb

## 2020-11-20 DIAGNOSIS — R931 Abnormal findings on diagnostic imaging of heart and coronary circulation: Secondary | ICD-10-CM

## 2020-11-20 DIAGNOSIS — R001 Bradycardia, unspecified: Secondary | ICD-10-CM

## 2020-11-20 DIAGNOSIS — R0683 Snoring: Secondary | ICD-10-CM

## 2020-11-20 DIAGNOSIS — R030 Elevated blood-pressure reading, without diagnosis of hypertension: Secondary | ICD-10-CM

## 2020-11-20 NOTE — Progress Notes (Unsigned)
3 Day ZIO XT monitor from in office inventory applied to patient.

## 2020-11-20 NOTE — Patient Instructions (Signed)
Medication Instructions:   Continue same medications  *If you need a refill on your cardiac medications before your next appointment, please call your pharmacy*   Lab Work:  None ordered   Testing/Procedures:  24 hour blood pressure monitor  3 day Zio monitor   Follow-Up: At [facility], you and your health needs are our priority.  As part of our continuing mission to provide you with exceptional heart care, we have created designated Provider Care Teams.  These Care Teams include your primary Cardiologist (physician) and Advanced Practice Providers (APPs -  Physician Assistants and Nurse Practitioners) who all work together to provide you with the care you need, when you need it.  We recommend signing up for the patient portal called "MyChart".  Sign up information is provided on this After Visit Summary.  MyChart is used to connect with patients for Virtual Visits (Telemedicine).  Patients are able to view lab/test results, encounter notes, upcoming appointments, etc.  Non-urgent messages can be sent to your provider as well.   To learn more about what you can do with MyChart, go to [email or web address].    Your next appointment:  Before Nov   The format for your next appointment: Office    Provider:  Dr.Harding

## 2020-11-20 NOTE — Progress Notes (Signed)
Primary Care Provider: Alysia Penna, MD Cardiologist: Bryan Lemma, MD Electrophysiologist: None  Clinic Note: Chief Complaint  Patient presents with   New Patient (Initial Visit)    Bradycardia, Dizziness & Elevated Coronary Calcium Score   ===================================  ASSESSMENT/PLAN   Problem List Items Addressed This Visit       Cardiology Problems   Sinus bradycardia, chronic - Primary (Chronic)    He has chronic bradycardia as noted in his clinic note.  I agree this does make beta-blocker or any other irritable agent contraindicated.  I suspect that is probably has to do with him being quite active.  However with having dizziness it would be reasonable to assess heart rate trend.  He shows me his smart watch showing heart rates ranging as low as the high 30s/low 40s but also rate responsiveness to exercise going up to 130 bpm.  1 would expect dizziness or near syncope to occur if there is evidence of heart block, and not necessarily sinus bradycardia.  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  This was evaluated with a stress test was nonischemic.  He is very active and has not had any active anginal symptoms.  I suggested that with discordant this elevated, my target LDL will be less than 70.  If he were to have symptoms at all of exertional dyspnea or atypical chest tightness/pressure, or even heart block related bradycardia, would consider ischemic evaluation with at a minimum Myoview.  Would likely not check coronary CTA based on the extent of calcification that is within the blooming artifact.       Relevant Medications   atorvastatin (LIPITOR) 80 MG tablet     Other   Snoring    With  hypertension, consider the possibility of OSA.   I will ask him to fill out Epworth score, when seen in follow-up       Relevant Orders   EKG 12-Lead (Completed)   24 hour blood pressure monitor   Elevated blood-pressure reading without diagnosis of hypertension (Chronic)    This is another interesting dilemma where his blood pressure today is quite high in the clinic at 160/90, but this morning at home before coming into the clinic it was 126/70 mmHg.  He states that his pressures at home usually range from systolics in the 110s to 140s.   Plan: 24 ambulatory blood pressure monitor. Agree with initiating either ACE inhibitor or ARB if blood pressure levels warrant management.       Relevant Orders   EKG 12-Lead (Completed)   24 hour blood pressure monitor   ===================================  HPI:    Steven Mcknight is a 75 y.o. male former smoker with a PMH below who presents today who is being seen today for the evaluation of BRADYCARDIA, DIZZINESS, HPYERTENSION & ELEVATED CORONARY CALCIUM SCORE at the request of Alysia Penna, MD.  [name] was seen by Dr. Link Snuffer on 09/13/2020-noted to still have issues with bradycardia.  Dizziness improving with vitamin B12.  Labile blood pressures.  Referred to cardiology  Recent Hospitalizations: None  Reviewed  CV studies:    The following studies were reviewed today: (if available, images/films reviewed: From Epic Chart or  Care Everywhere) CT Cardiac Scoring 05/02/2014 ([facility]):  Total Calcium Score os 1233. Most of the calcified plaque noted on LAD -> RCA then LCx.  Myoview 05/09/2014 (Dr. [facility]): EF 54%.  Normal perfusion.  No or WMA.   Interval History:   Rajat Staver presents here today at the request of Dr. Ardeth Perfect for several issues.  Not actually mentioned in the initial statement was the fact that he has a coronary calcium score of 1200 from 2016.  Done himself is very active, plays 18 holes of golf  at least 3 to 4 days a week.  He says he is able to get his heart rate up to the 130s with exercising on and denies any chest pain or pressure with exertion.  He was noting dizziness and vertigo symptoms that seem to improved with initiation of vitamin B12 replacement.  He has noticed dizziness with heart rates that are low, indicating that on his watch monitor, he has recorded heart rates (last night) down to 38 and 42 bpm.  While he was recording this, he did not notice any dizziness.  He also notes having blood pressures that can range as high as 160/90 mmHg as noted today, and as low as the 110/70 mmHg last night.  Just this morning it was little high for him at home it was 146/70. He showed me his average rate and blood pressure readings.  The systolic blood pressure ranges were mostly between 120 and 135 mmHg.  He denies any headaches or blurred vision, no syncope or near syncope.  No CHF symptoms or any symptoms of prolonged arrhythmias.  When asked about the coronary calcium score result, he tells me that he was seen by Dr. Einar Gip back in 2016 when this test was done.  He had a Myoview that was negative/nonischemic and did not follow back up again.  He did not have at that time nor has he had any since then symptoms of chest tightness or pressure with rest or exertion.  He only gets short of breath if he is try to wash to carry something up steps or vertigo.  Not on anything like routine exertion.  CV Review of Symptoms (Summary) Cardiovascular ROS: no chest pain or dyspnea on exertion positive for - palpitations and resting Bradycardia, BP swings & Dizziness negative for - edema, orthopnea, paroxysmal nocturnal dyspnea, rapid heart rate, shortness of breath, or syncope/near syncope, TIA/amaurosis fugax or claudication.  He does not seem to visit with his heart rate in the 30s and 40s.  REVIEWED OF SYSTEMS   Review of Systems  Constitutional:  Negative for malaise/fatigue and weight loss.   HENT:  Negative for congestion and nosebleeds.   Respiratory:  Negative for cough and shortness of breath.   Cardiovascular:  Negative for leg swelling.  Gastrointestinal:  Negative for blood in stool and melena.  Genitourinary:  Negative for hematuria.       Erectile Dysfunction   Musculoskeletal:  Negative for joint pain.  Neurological:  Positive for dizziness. Negative for focal weakness and weakness.  Psychiatric/Behavioral:  Negative for memory loss. The patient does not have insomnia.    I have reviewed and (if needed) personally updated the patient's problem list, medications, allergies, past medical and surgical history, social and family history.   PAST MEDICAL HISTORY   Past Medical History:  Diagnosis Date   Agatston coronary artery calcium score greater than 400 05/02/2014   CORONARY CALCIUM SCORE: ([facility]) Total Calcium Score os 1233.  Most of the calcified plaque noted on LAD -> RCA then LCx. => Myoview on 05/09/2014: EF 54% with no ischemia or infarction./Normal perfusion.   Allergy    Anxiety    Cataract    both eyes- removed   Emphysema lung ([employer])    As noted on chest CT   GERD (gastroesophageal reflux disease)    Hemorrhoids    Hx of colonic polyps    Colonoscopy's in [place]    Hyperlipidemia with target LDL less than 55    Male erectile disorder    on Viagra   Vitamin B12 deficiency    Symptom was vertigo.    PAST SURGICAL HISTORY   Past Surgical History:  Procedure Laterality Date   APPENDECTOMY     CATARACT EXTRACTION, BILATERAL     FOOT SURGERY     INCISIONAL HERNIA REPAIR Right 05/12/2017   KNEE SURGERY     Arthroscopic surgery   NASAL SINUS SURGERY     NM MYOVIEW LTD  05/09/2014   (Dr. [facility]): EF 54%.  Normal perfusion.  No or WMA.   VASECTOMY     There is no immunization history on file for this patient.  MEDICATIONS/ALLERGIES   Current Meds  Medication Sig   ALPRAZolam (XANAX) 0.25 MG tablet Take 0.25 mg by  mouth as needed for anxiety.   aspirin 81 MG tablet Take 81 mg by mouth daily.   atorvastatin (LIPITOR) 80 MG tablet Take 80 mg by mouth daily.   b complex vitamins capsule Take 1 capsule by mouth daily.   cyanocobalamin (,VITAMIN B-12,) 1000 MCG/ML injection Inject 1,000 mcg into the muscle daily.   OVER THE COUNTER MEDICATION Place 2 sprays into both nostrils as needed (for congestion). OTC [employer] Nasal Spray   pantoprazole (PROTONIX) 40 MG tablet Take 40 mg by mouth daily.   Current Facility-Administered Medications for the 11/20/20 encounter (Office Visit) with Leonie Man, MD  Medication   0.9 %  sodium chloride infusion    No Known Allergies  SOCIAL HISTORY/FAMILY HISTORY   Reviewed in Epic:  Pertinent findings:  Social History   Tobacco Use   Smoking status: Former   Smokeless tobacco: Never   Tobacco comments:    He may be smokes a cigar once every couple weeks.  Substance Use Topics   Alcohol use: Yes    Comment: one daily    Drug use: No   Social History   Social History Narrative   He is married father of 2 daughters, grandfather of 34 (as yet no great-grandkids).     Lives in [place] with his wife, Altha Harm.   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   Graduate of [employer].   30+ pack year smoking history, quit in 2005.   Very active-plays golf frequently-walks quite a bit on the course.[place] Kitchen  Also walks extensively.      Daily caffeine        OBJCTIVE -PE, EKG, labs   Wt Readings from Last 3 Encounters:  11/20/20 187 lb (84.8 kg)  02/10/17 182 lb (82.6 kg)  10/28/16 181 lb (82.1 kg)    Physical Exam: BP (!) 160/90 (BP Location: Right Arm)   Pulse (!) 52   Ht $R'5\' 10"'bE$  (1.778 m)   Wt 187 lb (84.8 kg)   SpO2 97%   BMI 26.83 kg/m  Physical Exam Constitutional:      General: He is not in acute distress.    Appearance:  Normal appearance. He is normal weight. He is not ill-appearing or  toxic-appearing.  HENT:     Head: Normocephalic and atraumatic.  Eyes:     Extraocular Movements: Extraocular movements intact.     Pupils: Pupils are equal, round, and reactive to light.  Neck:     Vascular: No carotid bruit or JVD.  Cardiovascular:     Rate and Rhythm: Regular rhythm. Bradycardia present. No extrasystoles are present.    Chest Wall: PMI is not displaced.     Pulses: Normal pulses.     Heart sounds: Normal heart sounds, S1 normal and S2 normal. No murmur heard.   No friction rub. No gallop.  Pulmonary:     Effort: Pulmonary effort is normal. No respiratory distress.     Breath sounds: Normal breath sounds. No wheezing, rhonchi or rales.  Chest:     Chest wall: No tenderness.  Abdominal:     General: Abdomen is flat. Bowel sounds are normal. There is no distension.     Palpations: Abdomen is soft. There is no mass (No HSM or bruit).     Tenderness: There is no abdominal tenderness.  Musculoskeletal:        General: No swelling. Normal range of motion.     Cervical back: Normal range of motion and neck supple.  Skin:    General: Skin is warm and dry.     Coloration: Skin is not pale.  Neurological:     General: No focal deficit present.     Mental Status: He is alert and oriented to person, place, and time.     Cranial Nerves: No cranial nerve deficit.     Motor: No weakness.     Gait: Gait normal.  Psychiatric:        Mood and Affect: Mood normal.        Behavior: Behavior normal.        Thought Content: Thought content normal.        Judgment: Judgment normal.    Adult ECG Report  Rate: 52 ;  Rhythm: sinus bradycardia and LAFB (-60 ) ; normal intervals durations.  Narrative Interpretation: Borderline EKG.  Recent Labs: 09/11/2020 TSH 1.23; TC 154, TG 55, HDL 65, LDL 78. CBC: W4.99, H GB 15.1, HCT 47.2, PLT  CMP: Na+ 138, K+ 4.2, CL- 103, CO2-27 BUN 15, Cr 4.0, Ca2+ 8.9, AST 29 ALT 22, Alk Phos 85. No results found for: CHOL, HDL, LDLCALC, LDLDIRECT,  TRIG, CHOLHDL  No results found for: TSH  ==================================================  COVID-19 Education: The signs and symptoms of COVID-19 were discussed with the patient and how to seek care for testing (follow up with PCP or arrange E-visit).    I spent a total of 22 minutes with the patient spent in direct patient consultation.  Additional time spent with chart review  / charting (studies, outside notes, etc): 18 min Total Time: 40 min  Current medicines are reviewed at length with the patient today.  (+/- concerns) N/A  This visit occurred during the SARS-CoV-2 public health emergency.  Safety protocols were in place, including screening questions prior to the visit, additional usage of staff PPE, and extensive cleaning of exam room while observing appropriate contact time as indicated for disinfecting solutions.  Notice: This dictation was prepared with Dragon dictation along with smaller phrase technology. Any transcriptional errors that result from this process are unintentional and may not be corrected upon review.  Patient Instructions / Medication Changes & Studies & Tests  Ordered   Patient Instructions  Medication Instructions:   Continue same medications  *If you need a refill on your cardiac medications before your next appointment, please call your pharmacy*   Lab Work:  None ordered   Testing/Procedures:  24 hour blood pressure monitor  3 day Zio monitor   Follow-Up: At [facility], you and your health needs are our priority.  As part of our continuing mission to provide you with exceptional heart care, we have created designated Provider Care Teams.  These Care Teams include your primary Cardiologist (physician) and Advanced Practice Providers (APPs -  Physician Assistants and Nurse Practitioners) who all work together to provide you with the care you need, when you need it.  We recommend signing up for the patient portal called "MyChart".   Sign up information is provided on this After Visit Summary.  MyChart is used to connect with patients for Virtual Visits (Telemedicine).  Patients are able to view lab/test results, encounter notes, upcoming appointments, etc.  Non-urgent messages can be sent to your provider as well.   To learn more about what you can do with MyChart, go to [email or web address].    Your next appointment:  Before Nov   The format for your next appointment: Office    Provider:  Dr.Jasmene Goswami    Studies Ordered:   Orders Placed This Encounter  Procedures   24 hour blood pressure monitor   EKG 12-Lead      Glenetta Hew, M.D., M.S. Interventional Cardiologist   Pager # [phone] Phone # [phone] [address] [place], [place] [address]   Thank you for choosing [facility] at [facility]!!

## 2020-11-20 NOTE — Progress Notes (Unsigned)
24 Hour ambulatory blood pressure monitor applied using standard adult cuff.

## 2020-11-23 ENCOUNTER — Encounter: Payer: Self-pay | Admitting: Cardiology

## 2020-11-23 NOTE — Assessment & Plan Note (Signed)
This is another interesting dilemma where his blood pressure today is quite high in the clinic at 160/90, but this morning at home before coming into the clinic it was 126/70 mmHg.  He states that his pressures at home usually range from systolics in the 0000000 to 0000000.   Plan: 24 ambulatory blood pressure monitor.  Agree with initiating either ACE inhibitor or ARB if blood pressure levels warrant management.

## 2020-11-23 NOTE — Assessment & Plan Note (Signed)
Relatively high coronary calcium score from 6 years ago.  This was evaluated with a stress test was nonischemic.  He is very active and has not had any active anginal symptoms.  I suggested that with discordant this elevated, my target LDL will be less than 70.  If he were to have symptoms at all of exertional dyspnea or atypical chest tightness/pressure, or even heart block related bradycardia, would consider ischemic evaluation with at a minimum Myoview.  Would likely not check coronary CTA based on the extent of calcification that is within the blooming artifact.

## 2020-11-23 NOTE — Assessment & Plan Note (Addendum)
He has chronic bradycardia as noted in his clinic note.  I agree this does make beta-blocker or any other irritable agent contraindicated.  I suspect that is probably has to do with him being quite active.  However with having dizziness it would be reasonable to assess heart rate trend.  He shows me his smart watch showing heart rates ranging as low as the high 30s/low 40s but also rate responsiveness to exercise going up to 130 bpm.  1 would expect dizziness or near syncope to occur if there is evidence of heart block, and not necessarily sinus bradycardia.  Plan: 48-hour Zio patch monitor

## 2020-11-23 NOTE — Assessment & Plan Note (Addendum)
With hypertension, consider the possibility of OSA.   I will ask him to fill out Epworth score, when seen in follow-up

## 2021-02-07 ENCOUNTER — Ambulatory Visit: Payer: Medicare Other | Admitting: Cardiology

## 2021-02-07 ENCOUNTER — Encounter: Payer: Self-pay | Admitting: Cardiology

## 2021-02-07 ENCOUNTER — Other Ambulatory Visit: Payer: Self-pay

## 2021-02-07 DIAGNOSIS — R030 Elevated blood-pressure reading, without diagnosis of hypertension: Secondary | ICD-10-CM | POA: Diagnosis not present

## 2021-02-07 DIAGNOSIS — R931 Abnormal findings on diagnostic imaging of heart and coronary circulation: Secondary | ICD-10-CM | POA: Diagnosis not present

## 2021-02-07 DIAGNOSIS — R001 Bradycardia, unspecified: Secondary | ICD-10-CM

## 2021-02-07 NOTE — Progress Notes (Signed)
Primary Care Provider: Alysia Penna, MD Cardiologist: Steven Lemma, MD Electrophysiologist: None  Clinic Note: Chief Complaint  Patient presents with   Follow-up    Test results-event monitor and BP monitor.    ===================================  ASSESSMENT/PLAN   Problem List Items Addressed This Visit       Cardiology Problems   Sinus bradycardia, chronic (Chronic)    He does have a sinus bradycardia, but not overly symptomatic, and on event monitor, his heart rates were relatively normal during her waking hours.  He had adequate response to exercise.  Lowest heart rates in the 30s and low 40s were during sleep hours.  Would not be indication for pacemaker placement. Would avoid AV nodal agents such as beta-blockers, not hydrating calcitonin blockers or digoxin.      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  Current LDL was 78 on atorvastatin.  80 mg.  Had nonischemic Myoview at time of his coronary calcium score.  No active symptoms.  Low threshold for considering a Myoview or actually invasive evaluation if he becomes symptomatic with concerning symptoms for angina.  He has labile blood pressures, but probably not a bad idea to initiate therapy with ARB plus or minus diuretic.  Plan:  Continue lipid management and consider addition of ARB for blood pressure control. Ensure close glycemic control Prophylactic aspirin 81 mg        Other   Elevated blood-pressure reading without diagnosis of hypertension (Chronic)    I did not choose to add a medication based on these findings on his monitor, but it would probably not be unreasonable to start either ARB or ACE inhibitor.  Will defer to PCP for management of the future.  Would not use beta-blocker or AV nodal agent based on bradycardia.     ===================================  HPI:    Steven Mcknight is a 75 y.o. male former smoker with  a PMH below who presents today who is being seen today for the evaluation of BRADYCARDIA, DIZZINESS, HPYERTENSION & ELEVATED CORONARY CALCIUM SCORE at the request of Steven Penna, MD.  Steven Mcknight was seen by Dr. Link Mcknight on 09/13/2020-noted to still have issues with bradycardia.  Dizziness improving with vitamin B12.  Labile blood pressures.  Referred to cardiology. He was seen for initial consultation back on November 20, 2020.  Based on artifact potential, would not consider a Myoview stress test if ischemic evaluation is warranted..  Blood pressures range from 126/70 mmHg in the clinic up to 160/90 mmHg, but he sent home with his usually ranges from the 110s to 140 mmHg range systolic. ->  I ordered a 48-hour Zio patch monitor 24-hour blood pressure monitor.   Recent Hospitalizations: None  Reviewed  CV studies:    The following studies were reviewed today: (if available, images/films reviewed: From Epic Chart or Care Everywhere) 24 BP monitor 11/29/2020: Overall mean pressure 127/73 mmHg with a mean heart rate 59 bpm. During waking periods average 130/75 mmHg with heart rate 62 bpm, during sleep 115/60 mmHg heart rate 49 bpm.  Maximum BP 157/90 mmHg 85 mmHg -> noted as "whitecoat".  Mean elevated BP range was 144/80 mmHg  3-day Zio patch monitor 11/20/2020: Predominantly sinus rhythm.  Heart rate range 36-123 bpm average 56 bpm.  Slowest heart rate noted at 4:45 AM.  Fastest roughly 10:10 AM.  Significant bradycardia not noted during waking hours.  2 short bursts of  PAT/PSVT.  Longest and fastest 7 beats at a rate of 148 bpm.  Interval History:   Steven Mcknight presents here for follow-up after his studies.He still has up-and-down blood pressures, the pressure here today is much higher than it usually is.  Not noticing any significant exercise intolerance or fatigue.  Able to get his heart rate up with exercise.Marland Kitchen  No chest pain or pressure or dyspnea on rest or exertion.  No palpitations.  No headaches  or blurred vision, dizziness.  No symptoms to suggest hypotension and no headaches or blurred vision with elevated blood pressure.  Still plays golf at least 3 to 4 days a week and is very active.  He has been limited and limited by low back pain, but still very active.  He still [name] little short of breath if he goes quickly up stairs or up a hill, The most notably if he is carrying something.  No chest pain or pressure.  CV Review of Symptoms (Summary) Cardiovascular ROS: no chest pain or dyspnea on exertion positive for -rare palpitations and resting Bradycardia without dizziness or wooziness.,  He still has BP swings & Dizziness negative for - edema, orthopnea, paroxysmal nocturnal dyspnea, rapid heart rate, shortness of breath, or syncope/near syncope, TIA/amaurosis fugax or claudication. Not really noticing fatigue or exercise intolerance.Marland Kitchen  REVIEWED OF SYSTEMS   Review of Systems  Constitutional:  Negative for malaise/fatigue and weight loss.  HENT:  Negative for congestion and nosebleeds.   Respiratory:  Negative for cough and shortness of breath.   Cardiovascular:  Negative for leg swelling.  Gastrointestinal:  Negative for blood in stool and melena.  Genitourinary:  Negative for hematuria.       Erectile Dysfunction   Musculoskeletal:  Positive for back pain. Negative for joint pain.  Neurological:  Positive for dizziness. Negative for focal weakness and weakness.  Psychiatric/Behavioral:  Negative for memory loss. The patient does not have insomnia.    I have reviewed and (if needed) personally updated the patient's problem list, medications, allergies, past medical and surgical history, social and family history.   PAST MEDICAL HISTORY   Past Medical History:  Diagnosis Date   Agatston coronary artery calcium score greater than 400 05/02/2014   CORONARY CALCIUM SCORE: ([facility]) Total Calcium Score os 1233. Most of the calcified plaque noted on LAD -> RCA then LCx.  => Myoview on 05/09/2014: EF 54% with no ischemia or infarction./Normal perfusion.   Allergy    Anxiety    Cataract    both eyes- removed   Emphysema lung ([place])    As noted on chest CT   Essential hypertension 11/2018   24-hour BP monitor: Overall mean pressure 127/73 mmHg with a mean heart rate 59 bpm.; During waking periods average 130/75 mmHg with heart rate 62 bpm, during sleep 115/60 mmHg heart rate 49 bpm.  Maximum BP 157/90 mmHg 85 mmHg -> noted as "whitecoat".  Mean elevated BP range was 144/80 mmHg   GERD (gastroesophageal reflux disease)    Hemorrhoids    Hx of colonic polyps    Colonoscopy's in [place]    Hyperlipidemia with target LDL less than 13    Male erectile disorder    on Viagra   Vitamin B12 deficiency    Symptom was vertigo.    PAST SURGICAL HISTORY   Past Surgical History:  Procedure Laterality Date   APPENDECTOMY     CARDIAC EVENT MONITOR-3-day Zio patch  11/21/2018   Predominantly sinus rhythm.  Heart  rate range 36-123 bpm average 56 bpm.  Slowest heart rate noted at 4:45 AM.  Fastest roughly 10:10 AM.  Significant bradycardia not noted during waking hours.  2 short bursts of PAT/PSVT.  Longest and fastest 7 beats at a rate of 148 bpm.   CATARACT EXTRACTION, BILATERAL     FOOT SURGERY     INCISIONAL HERNIA REPAIR Right 05/12/2017   KNEE SURGERY     Arthroscopic surgery   NASAL SINUS SURGERY     NM MYOVIEW LTD  05/09/2014   (Dr. [facility]): EF 54%.  Normal perfusion.  No or WMA.   VASECTOMY     There is no immunization history on file for this patient.  MEDICATIONS/ALLERGIES   Current Meds  Medication Sig   ALPRAZolam (XANAX) 0.25 MG tablet Take 0.25 mg by mouth as needed for anxiety.   aspirin 81 MG tablet Take 81 mg by mouth daily.   atorvastatin (LIPITOR) 80 MG tablet Take 80 mg by mouth daily.   b complex vitamins capsule Take 1 capsule by mouth daily.   OVER THE COUNTER MEDICATION Place 2 sprays into both nostrils as needed  (for congestion). OTC [employer] Nasal Spray   pantoprazole (PROTONIX) 40 MG tablet Take 40 mg by mouth daily.   [DISCONTINUED] cyanocobalamin (,VITAMIN B-12,) 1000 MCG/ML injection Inject 1,000 mcg into the muscle daily.   [DISCONTINUED] Esomeprazole Magnesium (NEXIUM 24HR PO) Take 1 capsule by mouth daily.   Current Facility-Administered Medications for the 02/07/21 encounter (Office Visit) with Leonie Man, MD  Medication   0.9 %  sodium chloride infusion    No Known Allergies  SOCIAL HISTORY/FAMILY HISTORY   Reviewed in Epic:  Pertinent findings:  Social History   Tobacco Use   Smoking status: Former   Smokeless tobacco: Never   Tobacco comments:    He may be smokes a cigar once every couple weeks.  Substance Use Topics   Alcohol use: Yes    Comment: one daily    Drug use: No   Social History   Social History Narrative   He is married father of 2 daughters, grandfather of 66 (as yet no great-grandkids).     Lives in [place] with his wife, Altha Harm.   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   Graduate of [employer].   30+ pack year smoking history, quit in 2005.   Very active-plays golf frequently-walks quite a bit on the course.Marland Kitchen  Also walks extensively.      Daily caffeine        OBJCTIVE -PE, EKG, labs   Wt Readings from Last 3 Encounters:  02/07/21 187 lb 9.6 oz (85.1 kg)  11/20/20 187 lb (84.8 kg)  02/10/17 182 lb (82.6 kg)    Physical Exam: BP (!) 165/83   Pulse 62   Ht $R'5\' 10"'GN$  (1.778 m)   Wt 187 lb 9.6 oz (85.1 kg)   SpO2 100%   BMI 26.92 kg/m  Physical Exam Constitutional:      General: He is not in acute distress.    Appearance: Normal appearance. He is normal weight. He is not ill-appearing or toxic-appearing.  HENT:     Head: Normocephalic and atraumatic.  Eyes:     Extraocular Movements: Extraocular movements intact.     Pupils: Pupils are equal, round, and reactive to light.  Neck:      Vascular: No carotid bruit or JVD.  Cardiovascular:     Rate and Rhythm: Normal  rate and regular rhythm. No extrasystoles are present.    Chest Wall: PMI is not displaced.     Pulses: Normal pulses.     Heart sounds: Normal heart sounds, S1 normal and S2 normal. No murmur heard.   No friction rub. No gallop.  Pulmonary:     Effort: Pulmonary effort is normal. No respiratory distress.     Breath sounds: Normal breath sounds.  Chest:     Chest wall: No tenderness.  Musculoskeletal:        General: No swelling. Normal range of motion.     Cervical back: Normal range of motion and neck supple.  Skin:    General: Skin is warm and dry.     Coloration: Skin is not pale.  Neurological:     General: No focal deficit present.     Mental Status: He is alert and oriented to person, place, and time.     Cranial Nerves: No cranial nerve deficit.     Gait: Gait normal.  Psychiatric:        Mood and Affect: Mood normal.        Behavior: Behavior normal.        Thought Content: Thought content normal.        Judgment: Judgment normal.    Adult ECG Report Not checked  Recent Labs: 09/11/2020 no new labs TSH 1.23; TC 154, TG 55, HDL 65, LDL 78. CBC: W4.99, H GB 15.1, HCT 47.2, PLT  CMP: Na+ 138, K+ 4.2, CL- 103, CO2-27 BUN 15, Cr 4.0, Ca2+ 8.9, AST 29 ALT 22, Alk P 85. No results found for: CHOL, HDL, LDLCALC, LDLDIRECT, TRIG, CHOLHDL  No results found for: TSH  ==================================================  COVID-19 Education: The signs and symptoms of COVID-19 were discussed with the patient and how to seek care for testing (follow up with PCP or arrange E-visit).    I spent a total of 21 minutes with the patient spent in direct patient consultation.  Additional time spent with chart review  / charting (studies, outside notes, etc): 15 min Total Time: 36 min  Current medicines are reviewed at length with the patient today.  (+/- concerns) N/A  This visit occurred during the  SARS-CoV-2 public health emergency.  Safety protocols were in place, including screening questions prior to the visit, additional usage of staff PPE, and extensive cleaning of exam room while observing appropriate contact time as indicated for disinfecting solutions.  Notice: This dictation was prepared with Dragon dictation along with smaller phrase technology. Any transcriptional errors that result from this process are unintentional and may not be corrected upon review.  Patient Instructions / Medication Changes & Studies & Tests Ordered   Patient Instructions  Medication Instructions:  No changes   *If you need a refill on your cardiac medications before your next appointment, please call your pharmacy*   Lab Work: Not needed If you have labs (blood work) drawn today and your tests are completely normal, you will receive your results only by: MyChart Message (if you have MyChart) OR A paper copy in the mail If you have any lab test that is abnormal or we need to change your treatment, we will call you to review the results.   Testing/Procedures:  Not needed  Follow-Up: At [facility], you and your health needs are our priority.  As part of our continuing mission to provide you with exceptional heart care, we have created designated Provider Care Teams.  These Care Teams include your  primary Cardiologist (physician) and Advanced Practice Providers (APPs -  Physician Assistants and Nurse Practitioners) who all work together to provide you with the care you need, when you need it.     Your next appointment:   As needed     The format for your next appointment:   In Person  Provider:   Dr Ellyn Hack  Anticipate following up in roughly 2 to 3 years to reassess Symptoms  Studies Ordered:   No orders of the defined types were placed in this encounter.     Glenetta Hew, M.D., M.S. Interventional Cardiologist   Pager # [phone] Phone # [phone] [address] [place], [place] [address]   Thank you for choosing Heartcare at [facility]!!

## 2021-02-07 NOTE — Patient Instructions (Signed)
Medication Instructions:  No changes   *If you need a refill on your cardiac medications before your next appointment, please call your pharmacy*   Lab Work: Not needed If you have labs (blood work) drawn today and your tests are completely normal, you will receive your results only by: [place] (if you have MyChart) OR A paper copy in the mail If you have any lab test that is abnormal or we need to change your treatment, we will call you to review the results.   Testing/Procedures:  Not needed  Follow-Up: At [facility], you and your health needs are our priority.  As part of our continuing mission to provide you with exceptional heart care, we have created designated Provider Care Teams.  These Care Teams include your primary Cardiologist (physician) and Advanced Practice Providers (APPs -  Physician Assistants and Nurse Practitioners) who all work together to provide you with the care you need, when you need it.     Your next appointment:   As needed     The format for your next appointment:   In Person  Provider:   Dr Ellyn Hack

## 2021-03-05 ENCOUNTER — Encounter: Payer: Self-pay | Admitting: Cardiology

## 2021-03-05 NOTE — Assessment & Plan Note (Signed)
I did not choose to add a medication based on these findings on his monitor, but it would probably not be unreasonable to start either ARB or ACE inhibitor.  Will defer to PCP for management of the future.  Would not use beta-blocker or AV nodal agent based on bradycardia.

## 2021-03-05 NOTE — Assessment & Plan Note (Signed)
He does have a sinus bradycardia, but not overly symptomatic, and on event monitor, his heart rates were relatively normal during her waking hours.  He had adequate response to exercise.  Lowest heart rates in the 30s and low 40s were during sleep hours.  Would not be indication for pacemaker placement. Would avoid AV nodal agents such as beta-blockers, not hydrating calcitonin blockers or digoxin.

## 2021-03-05 NOTE — Assessment & Plan Note (Addendum)
With target LDL close to 70 or lower as possible.  Current LDL was 78 on atorvastatin.  80 mg.  Had nonischemic Myoview at time of his coronary calcium score.  No active symptoms.  Low threshold for considering a Myoview or actually invasive evaluation if he becomes symptomatic with concerning symptoms for angina.  He has labile blood pressures, but probably not a bad idea to initiate therapy with ARB plus or minus diuretic.  Plan:   Continue lipid management and consider addition of ARB for blood pressure control.  Ensure close glycemic control  Prophylactic aspirin 81 mg

## 2021-11-08 ENCOUNTER — Encounter: Payer: Self-pay | Admitting: Internal Medicine
